# Patient Record
Sex: Male | Born: 1970 | Hispanic: No | Marital: Married | Smoking: Never smoker
Health system: Southern US, Community
[De-identification: ages and names within clinical notes are randomized; demographics above are authoritative.]

## PROBLEM LIST (undated history)

## (undated) DIAGNOSIS — E119 Type 2 diabetes mellitus without complications: Secondary | ICD-10-CM

## (undated) DIAGNOSIS — I10 Essential (primary) hypertension: Secondary | ICD-10-CM

## (undated) HISTORY — DX: Type 2 diabetes mellitus without complications: E11.9

## (undated) HISTORY — DX: Essential (primary) hypertension: I10

## (undated) HISTORY — PX: OTHER SURGICAL HISTORY: SHX169

---

## 2011-10-22 ENCOUNTER — Ambulatory Visit: Payer: Self-pay

## 2011-10-22 DIAGNOSIS — J111 Influenza due to unidentified influenza virus with other respiratory manifestations: Secondary | ICD-10-CM

## 2011-10-22 DIAGNOSIS — I10 Essential (primary) hypertension: Secondary | ICD-10-CM

## 2011-10-22 DIAGNOSIS — J209 Acute bronchitis, unspecified: Secondary | ICD-10-CM

## 2012-01-14 ENCOUNTER — Ambulatory Visit: Payer: Self-pay | Admitting: Internal Medicine

## 2012-01-14 VITALS — BP 133/82 | HR 61 | Temp 98.3°F | Resp 16 | Ht 68.0 in | Wt 156.4 lb

## 2012-01-14 DIAGNOSIS — I10 Essential (primary) hypertension: Secondary | ICD-10-CM | POA: Insufficient documentation

## 2012-01-14 DIAGNOSIS — Z0000 Encounter for general adult medical examination without abnormal findings: Secondary | ICD-10-CM

## 2012-01-14 DIAGNOSIS — Z79899 Other long term (current) drug therapy: Secondary | ICD-10-CM

## 2012-01-14 LAB — POCT CBC
Granulocyte percent: 58.1 %G (ref 37–80)
Lymph, poc: 1.9 (ref 0.6–3.4)
MCHC: 33.1 g/dL (ref 31.8–35.4)
MID (cbc): 0.3 (ref 0–0.9)
POC Granulocyte: 3.1 (ref 2–6.9)
POC LYMPH PERCENT: 35.9 %L (ref 10–50)
POC MID %: 6 %M (ref 0–12)
Platelet Count, POC: 344 10*3/uL (ref 142–424)
RDW, POC: 14 %

## 2012-01-14 LAB — COMPREHENSIVE METABOLIC PANEL
ALT: 16 U/L (ref 0–53)
AST: 16 U/L (ref 0–37)
CO2: 26 mEq/L (ref 19–32)
Chloride: 104 mEq/L (ref 96–112)
Creat: 0.93 mg/dL (ref 0.50–1.35)
Sodium: 139 mEq/L (ref 135–145)
Total Bilirubin: 0.7 mg/dL (ref 0.3–1.2)
Total Protein: 7.4 g/dL (ref 6.0–8.3)

## 2012-01-14 LAB — POCT UA - MICROSCOPIC ONLY
Casts, Ur, LPF, POC: NEGATIVE
WBC, Ur, HPF, POC: NEGATIVE
Yeast, UA: NEGATIVE

## 2012-01-14 LAB — LIPID PANEL
HDL: 47 mg/dL (ref >39)
LDL Cholesterol: 121 mg/dL — ABNORMAL HIGH (ref 0–99)
Total CHOL/HDL Ratio: 4 Ratio

## 2012-01-14 LAB — TSH: TSH: 2.406 u[IU]/mL (ref 0.350–4.500)

## 2012-01-14 LAB — GLUCOSE, POCT (MANUAL RESULT ENTRY): POC Glucose: 96

## 2012-01-14 MED ORDER — AMLODIPINE BESYLATE 10 MG PO TABS: 10.0000 mg | ORAL_TABLET | Freq: Every day | ORAL | Status: DC

## 2012-01-14 NOTE — Patient Instructions (Signed)

## 2012-01-14 NOTE — Progress Notes (Signed)
  Subjective:    Patient ID: Lonnie Richardson, male    DOB: [birth year], 41 y.o.   MRN: [id number]  HPI Mild HTN controlled. Feels great See scanned hx    Review of Systems See scanned ROS     Objective:   Physical Exam Complete exam normal        Assessment & Plan:  Continue exercising See eye MD Refill meds 1 year

## 2012-11-02 ENCOUNTER — Other Ambulatory Visit: Payer: Self-pay | Admitting: Internal Medicine

## 2012-11-09 ENCOUNTER — Ambulatory Visit: Payer: Self-pay | Admitting: Family Medicine

## 2012-11-09 VITALS — BP 110/72 | HR 73 | Temp 97.8°F | Resp 16 | Ht 67.5 in | Wt 165.8 lb

## 2012-11-09 DIAGNOSIS — H6500 Acute serous otitis media, unspecified ear: Secondary | ICD-10-CM

## 2012-11-09 DIAGNOSIS — J029 Acute pharyngitis, unspecified: Secondary | ICD-10-CM

## 2012-11-09 DIAGNOSIS — I10 Essential (primary) hypertension: Secondary | ICD-10-CM

## 2012-11-09 MED ORDER — CEFDINIR 300 MG PO CAPS: 300.0000 mg | ORAL_CAPSULE | Freq: Two times a day (BID) | ORAL | Status: DC

## 2012-11-09 MED ORDER — AMLODIPINE BESYLATE 10 MG PO TABS: 10.0000 mg | ORAL_TABLET | Freq: Every day | ORAL | Status: DC

## 2012-11-09 NOTE — Progress Notes (Signed)
Subjective:    Patient ID: Lonnie Richardson, male    DOB: 01-31-1971, 42 y.o.   MRN: [id number] Chief Complaint  Patient presents with  . Hypertension    medication refill  . Cough    2 days  . Sore Throat    HPI  Req refill on norvasc 10 - checks bp outside of office and usu 120s-130s.  Reports med was changed once and he has severe edema so he just want to cont current med at current dose.  Sore throat and cough x 2d, worsening.  Mild nasal congestion, no f/c.  No GI/GU sxs. No myalgias/arthralgias.  Past Medical History  Diagnosis Date  . Hypertension    Current Outpatient Prescriptions on File Prior to Visit  Medication Sig Dispense Refill  . amLODipine (NORVASC) 10 MG tablet Take 1 tablet (10 mg total) by mouth daily.  90 tablet  3   No Known Allergies   Review of Systems  Constitutional: Negative for fever, chills, diaphoresis, activity change, appetite change, fatigue and unexpected weight change.  HENT: Positive for congestion, sore throat, rhinorrhea and postnasal drip. Negative for hearing loss, ear pain, mouth sores, neck pain, neck stiffness, sinus pressure, tinnitus and ear discharge.   Respiratory: Positive for cough. Negative for shortness of breath.   Cardiovascular: Negative for chest pain.  Gastrointestinal: Negative for nausea, vomiting, abdominal pain, diarrhea and constipation.  Genitourinary: Negative for dysuria.  Musculoskeletal: Negative for myalgias and arthralgias.  Skin: Negative for rash.  Neurological: Positive for headaches. Negative for syncope.  Hematological: Positive for adenopathy.  Psychiatric/Behavioral: Negative for sleep disturbance.      BP 110/72  Pulse 73  Temp 97.8 F (36.6 C) (Oral)  Resp 16  Ht 5' 7.5" (1.715 m)  Wt 165 lb 12.8 oz (75.206 kg)  BMI 25.58 kg/m2  SpO2 100% Objective:   Physical Exam  Constitutional: He is oriented to person, place, and time. He appears well-developed and well-nourished. No distress.  HENT:    Head: Normocephalic and atraumatic.  Right Ear: External ear and ear canal normal. Tympanic membrane is retracted. A middle ear effusion is present.  Left Ear: External ear and ear canal normal. Tympanic membrane is injected, erythematous and retracted. A middle ear effusion is present.  Nose: Mucosal edema and rhinorrhea present.  Mouth/Throat: Uvula is midline and mucous membranes are normal. Posterior oropharyngeal edema and posterior oropharyngeal erythema present. No oropharyngeal exudate.  Eyes: Conjunctivae normal are normal. Right eye exhibits no discharge. Left eye exhibits no discharge. No scleral icterus.  Neck: Normal range of motion. Neck supple. No thyromegaly present.  Cardiovascular: Normal rate, regular rhythm, normal heart sounds and intact distal pulses.   Pulmonary/Chest: Effort normal and breath sounds normal. No respiratory distress.  Lymphadenopathy:       Head (right side): Submandibular adenopathy present. No preauricular and no posterior auricular adenopathy present.       Head (left side): Submandibular adenopathy present. No preauricular and no posterior auricular adenopathy present.    He has cervical adenopathy.       Left cervical: Superficial cervical adenopathy present.       Right: No supraclavicular adenopathy present.       Left: No supraclavicular adenopathy present.  Neurological: He is alert and oriented to person, place, and time.  Skin: Skin is warm and dry. He is not diaphoretic. No erythema.  Psychiatric: He has a normal mood and affect. His behavior is normal.  Assessment & Plan:   1. HTN - refill norvasc 10 x 1 yr. BP stable and pt does not want to change med despite expense 2. Otitis media - cefdinir 300 bid x 1 wk 3. Pharyngitis - salt water gargles, hot tea w/ lemon and honey, steam. RTC if worsening

## 2013-07-01 ENCOUNTER — Other Ambulatory Visit: Payer: Self-pay | Admitting: Family Medicine

## 2014-01-13 ENCOUNTER — Other Ambulatory Visit: Payer: Self-pay | Admitting: Family Medicine

## 2014-09-05 ENCOUNTER — Ambulatory Visit: Payer: Self-pay | Admitting: Internal Medicine

## 2014-10-09 ENCOUNTER — Ambulatory Visit (INDEPENDENT_AMBULATORY_CARE_PROVIDER_SITE_OTHER): Payer: 59 | Admitting: Family Medicine

## 2014-10-09 VITALS — BP 120/74 | HR 78 | Temp 98.8°F | Resp 18 | Ht 68.0 in | Wt 178.4 lb

## 2014-10-09 DIAGNOSIS — J029 Acute pharyngitis, unspecified: Secondary | ICD-10-CM

## 2014-10-09 DIAGNOSIS — L818 Other specified disorders of pigmentation: Secondary | ICD-10-CM

## 2014-10-09 DIAGNOSIS — L819 Disorder of pigmentation, unspecified: Secondary | ICD-10-CM

## 2014-10-09 DIAGNOSIS — R9431 Abnormal electrocardiogram [ECG] [EKG]: Secondary | ICD-10-CM

## 2014-10-09 DIAGNOSIS — Z0000 Encounter for general adult medical examination without abnormal findings: Secondary | ICD-10-CM

## 2014-10-09 DIAGNOSIS — Z8249 Family history of ischemic heart disease and other diseases of the circulatory system: Secondary | ICD-10-CM

## 2014-10-09 DIAGNOSIS — R739 Hyperglycemia, unspecified: Secondary | ICD-10-CM

## 2014-10-09 DIAGNOSIS — E119 Type 2 diabetes mellitus without complications: Secondary | ICD-10-CM

## 2014-10-09 DIAGNOSIS — Z23 Encounter for immunization: Secondary | ICD-10-CM

## 2014-10-09 DIAGNOSIS — I10 Essential (primary) hypertension: Secondary | ICD-10-CM

## 2014-10-09 LAB — COMPREHENSIVE METABOLIC PANEL
ALK PHOS: 60 U/L (ref 39–117)
ALT: 23 U/L (ref 0–53)
AST: 14 U/L (ref 0–37)
Albumin: 4.2 g/dL (ref 3.5–5.2)
BILIRUBIN TOTAL: 0.5 mg/dL (ref 0.2–1.2)
BUN: 9 mg/dL (ref 6–23)
CO2: 26 mEq/L (ref 19–32)
Calcium: 9.5 mg/dL (ref 8.4–10.5)
Chloride: 104 mEq/L (ref 96–112)
Creat: 0.97 mg/dL (ref 0.50–1.35)
GLUCOSE: 134 mg/dL — AB (ref 70–99)
Potassium: 4.3 mEq/L (ref 3.5–5.3)
SODIUM: 138 meq/L (ref 135–145)
TOTAL PROTEIN: 7.2 g/dL (ref 6.0–8.3)

## 2014-10-09 LAB — GLUCOSE, POCT (MANUAL RESULT ENTRY): POC Glucose: 138 mg/dl — AB (ref 70–99)

## 2014-10-09 LAB — POCT RAPID STREP A (OFFICE): RAPID STREP A SCREEN: NEGATIVE

## 2014-10-09 LAB — TSH: TSH: 2.797 u[IU]/mL (ref 0.350–4.500)

## 2014-10-09 LAB — LIPID PANEL
CHOL/HDL RATIO: 4.1 ratio
Cholesterol: 176 mg/dL (ref 0–200)
HDL: 43 mg/dL (ref >39)
LDL Cholesterol: 114 mg/dL — ABNORMAL HIGH (ref 0–99)
Triglycerides: 93 mg/dL (ref <150)
VLDL: 19 mg/dL (ref 0–40)

## 2014-10-09 LAB — POCT GLYCOSYLATED HEMOGLOBIN (HGB A1C): Hemoglobin A1C: 7.3

## 2014-10-09 MED ORDER — AMLODIPINE BESYLATE 10 MG PO TABS: 10.0000 mg | ORAL_TABLET | Freq: Every day | ORAL | Status: DC

## 2014-10-09 MED ORDER — METFORMIN HCL 500 MG PO TABS: 500.0000 mg | ORAL_TABLET | Freq: Every day | ORAL | Status: DC

## 2014-10-09 NOTE — Progress Notes (Addendum)
Subjective:  This chart was scribed for [name], MD by Dellis Filbert, ED Scribe at [facility].The patient was seen in exam room 14 and the patient's care was started at 8:35 AM.   Patient ID: Lonnie Richardson, male    DOB: 1971/01/30, 43 y.o.   MRN: [id number] Chief Complaint  Patient presents with  . Annual Exam  . Sore Throat    Since Friday. Has improved a little   HPI HPI Comments: Lonnie Richardson is a 43 y.o. male who presents to [facility] complaining of a sore throat. Pt is also here for a PE and medication refill.  Pt has a history of HTN, takes norvasc 10 mg qd. Last  lab work was in 2013, border line elevated glucose of 103. Kidney function normal, border line lipids with LDL of 121. He does regularly check his BP at home and note no problems with his BP. He has no side effects with his current medication. Pt has had something to drink this morning. He denies thyroid problems.  Pt complains of a sore throat, onset 3 days ago, it was worse over the weekend but it has improved since. He has cough, generalized weakness, congestion, minor rhinorrhea, chills as associated symptoms. Pt states his symptoms have improved over the weekend. He has tried airbrone with some relief. He denies fever and sick contacts.  Pt also complains of a rash on both his legs, onset 2-3 years ago. Pt did have swelling in his legs but once he changed his HTN to norvasc his swelling subsided. The swelling lasted for about 1-2 weeks. He states his legs also become dry in the night.  Annual Exam: No FHx of cancer or any other medical problems, His father did have heart disease and PTCA at 12. Pt denies CP, SOB. Pt does not currently exercise.   He denies any liver problems.   Pt does not see a dentist and has never seen an optometrist. Pt is married and has no partners outside of his marriage, he denies a chance of STDs. Pt has never gotten a flu shot and he refuses one.  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  Patient Active Problem List   Diagnosis Date Noted  . HTN (hypertension) 01/14/2012   Past Medical History  Diagnosis Date  . Hypertension    History reviewed. No pertinent past surgical history. No Known Allergies Prior to Admission medications   Medication Sig Start Date End Date Taking? Authorizing Provider  amLODipine (NORVASC) 10 MG tablet Take 1 tablet (10 mg total) by mouth daily. 11/09/12 11/09/13  Shawnee Knapp, MD  cefdinir (OMNICEF) 300 MG capsule Take 1 capsule (300 mg total) by mouth 2 (two) times daily. Patient not taking: Reported on 10/09/2014 11/09/12   Shawnee Knapp, MD   History   Social History  . Marital Status: Married    Spouse Name: N/A    Number of Children: N/A  . Years of Education: N/A   Occupational History  . Not on file.   Social History Main Topics  . Smoking status: Never Smoker   . Smokeless tobacco: Not on file  . Alcohol Use: Yes  . Drug Use: No  . Sexual Activity: Yes   Other Topics Concern  . Not on file   Social History Narrative   Review of Systems  Constitutional: Positive for chills. Negative for fever.  HENT: Positive for congestion, rhinorrhea and sore throat.   Respiratory: Positive for  cough. Negative for shortness of breath.   Cardiovascular: Negative for chest pain and leg swelling.  Musculoskeletal: Positive for myalgias.  All other systems reviewed and are negative. 13 point ROS per pt health survey reviewed. Negative other than above     Objective:  BP 120/74 mmHg  Pulse 78  Temp(Src) 98.8 F (37.1 C) (Oral)  Resp 18  Ht _0  (1.727 m)  Wt 178 lb 6.4 oz (80.922 kg)  BMI 27.13 kg/m2  SpO2 98%  Physical Exam  Constitutional: He is oriented to person, place, and time. He appears well-developed and well-nourished.  HENT:  Head: Normocephalic and atraumatic.  Right Ear: Tympanic membrane, external ear and ear canal normal.  Left Ear: Tympanic membrane, external ear and ear canal normal.  Nose:  No rhinorrhea.  Mouth/Throat: Oropharynx is clear and moist and mucous membranes are normal. No oropharyngeal exudate or posterior oropharyngeal erythema.  No sinus tenderness  Eyes: Conjunctivae and EOM are normal. Pupils are equal, round, and reactive to light.  Neck: Normal range of motion. Neck supple.  Cardiovascular: Normal rate, regular rhythm, normal heart sounds and intact distal pulses.   No murmur heard. Pulmonary/Chest: Effort normal and breath sounds normal. He has no wheezes. He has no rhonchi. He has no rales.  Abdominal: Soft. There is no tenderness.  Musculoskeletal: Normal range of motion.  Lymphadenopathy:    He has no cervical adenopathy.  Neurological: He is alert and oriented to person, place, and time.  Skin: Skin is warm and dry. No rash noted.  Hyperpigmentation with some decreased hair lower legs bilaterally. No erythema or other rash.  Psychiatric: He has a normal mood and affect. His behavior is normal.  Nursing note and vitals reviewed. EKG; Sinus rhythm rate 67, flat, no specific T-waves and inferior leads. As well as V5, V6. No acute changes. Results for orders placed or performed in visit on 10/09/14  POCT glucose (manual entry)  Result Value Ref Range   POC Glucose 138 (A) 70 - 99 mg/dl  POCT rapid strep A  Result Value Ref Range   Rapid Strep A Screen Negative Negative  POCT glycosylated hemoglobin (Hb A1C)  Result Value Ref Range   Hemoglobin A1C 7.3       Assessment & Plan:   Lonnie Richardson is a 43 y.o. male Annual physical exam - Plan: TSH, Lipid panel, Comprehensive metabolic panel, EKG 10-CHEN, POCT glucose (manual entry)  --anticipatory guidance as below in AVS, screening labs above. Health maintenance items as above in HPI discussed/recommended as applicable.   Hyperglycemia Type 2 diabetes mellitus without complication - Plan: metFORMIN (GLUCOPHAGE) 500 MG tablet, Ambulatory referral to diabetic education- Plan: Comprehensive metabolic  panel, POCT glucose (manual entry), POCT glycosylated hemoglobin (Hb A1C)  -new diagnosis of diabetes. Start metformin 578m QD, [facility].  Can purchase meter prior or may be able to receive/recieve teaching there. Info given below, but can discuss further in few weeks.    Essential hypertension - Plan: Comprehensive metabolic panel, amLODipine (NORVASC) 10 MG tablet  -stable. Refilled norvasc at current dose. Anticipate change or adjust dose to add ace inhibitor with diabetes - can discuss at follow up .  Sore throat - Plan: POCT rapid strep A, Throat culture ([facility])  -viral illness likely. Sx care, info below, rtc precautions.   Family history of coronary artery disease - Plan: Lipid panel, Comprehensive metabolic panel, EKG 127-POEU Ambulatory referral to Cardiology.Nonspecific abnormal electrocardiogram (ECG) (EKG) - Plan: Ambulatory referral to  Cardiology  -FH of CAD and a few questionable findings on EKG. with RF"s of HTN. FH CAD, and new diagnosis of diabetes - will refer to cardiology for eval +/- stress testing.   Stasis pigmentation of lower extremity  -may be related to concurrent DM2, and prior hx of LE edema. Will follow up in few weeks. Labs pending above.   Need for Tdap vaccination - Plan: Tdap vaccine greater than or equal to 7yo IM given. Refused flu vaccine.      Meds ordered this encounter  Medications  . metFORMIN (GLUCOPHAGE) 500 MG tablet    Sig: Take 1 tablet (500 mg total) by mouth daily with breakfast.    Dispense:  30 tablet    Refill:  1  . amLODipine (NORVASC) 10 MG tablet    Sig: Take 1 tablet (10 mg total) by mouth daily.    Dispense:  90 tablet    Refill:  1   Patient Instructions  You should receive a call or letter about your lab results within the next week to 10 days.  Lozenges for sore throat. Fluids, rest, other information below.  Your labwork indicates diabetes today.  It is close to "goal level", but can start metformin  for now to help with blood sugar control.  I will also refer you to nutrition/diabetes education. They can arrange a blood sugar meter there and other teaching.  Follow up with me in the next 3-4 weeks to recheck above.  This can be done as an appointment if you wish.  Return to discuss darkening of skin further, but want to start with other labs first. This may be due to swelling in legs in past, but more likely related to your blood sugar - diabetes.    Your EKG was not completely normal, but not showing definite signs of heart issues. With your family history of heart problems in father, recommend referral to cardiologist to review this EKG and decide if any further workup needed. Recommend you avoid exertional exercise until evaluated by cardiology first. Return to the clinic or go to the nearest emergency room if any of your symptoms worsen or new symptoms occur.   Diabetes and Standards of Medical Care Diabetes is complicated. You may find that your diabetes team includes a dietitian, nurse, diabetes educator, eye doctor, and more. To help everyone know what is going on and to help you get the care you deserve, the following schedule of care was developed to help keep you on track. Below are the tests, exams, vaccines, medicines, education, and plans you will need. HbA1c test This test shows how well you have controlled your glucose over the past 2-3 months. It is used to see if your diabetes management plan needs to be adjusted.   It is performed at least 2 times a year if you are meeting treatment goals.  It is performed 4 times a year if therapy has changed or if you are not meeting treatment goals. Blood pressure test  This test is performed at every routine medical visit. The goal is less than 140/90 mm Hg for most people, but 130/80 mm Hg in some cases. Ask your health care provider about your goal. Dental exam  Follow up with the dentist regularly. Eye exam  If you are diagnosed  with type 1 diabetes as a child, get an exam upon reaching the age of 81 years or older and have had diabetes for 3-5 years. Yearly eye exams are recommended after  that initial eye exam.  If you are diagnosed with type 1 diabetes as an adult, get an exam within 5 years of diagnosis and then yearly.  If you are diagnosed with type 2 diabetes, get an exam as soon as possible after the diagnosis and then yearly. Foot care exam  Visual foot exams are performed at every routine medical visit. The exams check for cuts, injuries, or other problems with the feet.  A comprehensive foot exam should be done yearly. This includes visual inspection as well as assessing foot pulses and testing for loss of sensation.  Check your feet nightly for cuts, injuries, or other problems with your feet. Tell your health care provider if anything is not healing. Kidney function test (urine microalbumin)  This test is performed once a year.  Type 1 diabetes: The first test is performed 5 years after diagnosis.  Type 2 diabetes: The first test is performed at the time of diagnosis.  A serum creatinine and estimated glomerular filtration rate (eGFR) test is done once a year to assess the level of chronic kidney disease (CKD), if present. Lipid profile (cholesterol, HDL, LDL, triglycerides)  Performed every 5 years for most people.  The goal for LDL is less than 100 mg/dL. If you are at high risk, the goal is less than 70 mg/dL.  The goal for HDL is 40 mg/dL-50 mg/dL for men and 50 mg/dL-60 mg/dL for women. An HDL cholesterol of 60 mg/dL or higher gives some protection against heart disease.  The goal for triglycerides is less than 150 mg/dL. Influenza vaccine, pneumococcal vaccine, and hepatitis B vaccine  The influenza vaccine is recommended yearly.  It is recommended that people with diabetes who are over 43 years old get the pneumonia vaccine. In some cases, two separate shots may be given. Ask your health  care provider if your pneumonia vaccination is up to date.  The hepatitis B vaccine is also recommended for adults with diabetes. Diabetes self-management education  Education is recommended at diagnosis and ongoing as needed. Treatment plan  Your treatment plan is reviewed at every medical visit. Document Released: 08/10/2009 Document Revised: 02/27/2014 Document Reviewed: 03/15/2013 [facility] Patient Information 2015 [place], [place]. This information is not intended to replace advice given to you by your health care provider. Make sure you discuss any questions you have with your health care provider.   Upper Respiratory Infection, Adult An upper respiratory infection (URI) is also sometimes known as the common cold. The upper respiratory tract includes the nose, sinuses, throat, trachea, and bronchi. Bronchi are the airways leading to the lungs. Most people improve within 1 week, but symptoms can last up to 2 weeks. A residual cough may last even longer.  CAUSES Many different viruses can infect the tissues lining the upper respiratory tract. The tissues become irritated and inflamed and often become very moist. Mucus production is also common. A cold is contagious. You can easily spread the virus to others by oral contact. This includes kissing, sharing a glass, coughing, or sneezing. Touching your mouth or nose and then touching a surface, which is then touched by another person, can also spread the virus. SYMPTOMS  Symptoms typically develop 1 to 3 days after you come in contact with a cold virus. Symptoms vary from person to person. They may include:  Runny nose.  Sneezing.  Nasal congestion.  Sinus irritation.  Sore throat.  Loss of voice (laryngitis).  Cough.  Fatigue.  Muscle aches.  Loss of appetite.  Headache.  Low-grade fever. DIAGNOSIS  You might diagnose your own cold based on familiar symptoms, since most people get a cold 2 to 3 times a year. Your caregiver  can confirm this based on your exam. Most importantly, your caregiver can check that your symptoms are not due to another disease such as strep throat, sinusitis, pneumonia, asthma, or epiglottitis. Blood tests, throat tests, and X-rays are not necessary to diagnose a common cold, but they may sometimes be helpful in excluding other more serious diseases. Your caregiver will decide if any further tests are required. RISKS AND COMPLICATIONS  You may be at risk for a more severe case of the common cold if you smoke cigarettes, have chronic heart disease (such as heart failure) or lung disease (such as asthma), or if you have a weakened immune system. The very young and very old are also at risk for more serious infections. Bacterial sinusitis, middle ear infections, and bacterial pneumonia can complicate the common cold. The common cold can worsen asthma and chronic obstructive pulmonary disease (COPD). Sometimes, these complications can require emergency medical care and may be life-threatening. PREVENTION  The best way to protect against getting a cold is to practice good hygiene. Avoid oral or hand contact with people with cold symptoms. Wash your hands often if contact occurs. There is no clear evidence that vitamin C, vitamin E, echinacea, or exercise reduces the chance of developing a cold. However, it is always recommended to get plenty of rest and practice good nutrition. TREATMENT  Treatment is directed at relieving symptoms. There is no cure. Antibiotics are not effective, because the infection is caused by a virus, not by bacteria. Treatment may include:  Increased fluid intake. Sports drinks offer valuable electrolytes, sugars, and fluids.  Breathing heated mist or steam (vaporizer or shower).  Eating chicken soup or other clear broths, and maintaining good nutrition.  Getting plenty of rest.  Using gargles or lozenges for comfort.  Controlling fevers with ibuprofen or acetaminophen as  directed by your caregiver.  Increasing usage of your inhaler if you have asthma. Zinc gel and zinc lozenges, taken in the first 24 hours of the common cold, can shorten the duration and lessen the severity of symptoms. Pain medicines may help with fever, muscle aches, and throat pain. A variety of non-prescription medicines are available to treat congestion and runny nose. Your caregiver can make recommendations and may suggest nasal or lung inhalers for other symptoms.  HOME CARE INSTRUCTIONS   Only take over-the-counter or prescription medicines for pain, discomfort, or fever as directed by your caregiver.  Use a warm mist humidifier or inhale steam from a shower to increase air moisture. This may keep secretions moist and make it easier to breathe.  Drink enough water and fluids to keep your urine clear or pale yellow.  Rest as needed.  Return to work when your temperature has returned to normal or as your caregiver advises. You may need to stay home longer to avoid infecting others. You can also use a face mask and careful hand washing to prevent spread of the virus. SEEK MEDICAL CARE IF:   After the first few days, you feel you are getting worse rather than better.  You need your caregiver's advice about medicines to control symptoms.  You develop chills, worsening shortness of breath, or brown or red sputum. These may be signs of pneumonia.  You develop yellow or brown nasal discharge or pain in the face, especially when you bend  forward. These may be signs of sinusitis.  You develop a fever, swollen neck glands, pain with swallowing, or white areas in the back of your throat. These may be signs of strep throat. SEEK IMMEDIATE MEDICAL CARE IF:   You have a fever.  You develop severe or persistent headache, ear pain, sinus pain, or chest pain.  You develop wheezing, a prolonged cough, cough up blood, or have a change in your usual mucus (if you have chronic lung disease).  You  develop sore muscles or a stiff neck. Document Released: 04/08/2001 Document Revised: 01/05/2012 Document Reviewed: 01/18/2014 [facility] Patient Information 2015 [place], [place]. This information is not intended to replace advice given to you by your health care provider. Make sure you discuss any questions you have with your health care provider.   Sore Throat A sore throat is pain, burning, irritation, or scratchiness of the throat. There is often pain or tenderness when swallowing or talking. A sore throat may be accompanied by other symptoms, such as coughing, sneezing, fever, and swollen neck glands. A sore throat is often the first sign of another sickness, such as a cold, flu, strep throat, or mononucleosis (commonly known as mono). Most sore throats go away without medical treatment. CAUSES  The most common causes of a sore throat include:  A viral infection, such as a cold, flu, or mono.  A bacterial infection, such as strep throat, tonsillitis, or whooping cough.  Seasonal allergies.  Dryness in the air.  Irritants, such as smoke or pollution.  Gastroesophageal reflux disease (GERD). HOME CARE INSTRUCTIONS   Only take over-the-counter medicines as directed by your caregiver.  Drink enough fluids to keep your urine clear or pale yellow.  Rest as needed.  Try using throat sprays, lozenges, or sucking on hard candy to ease any pain (if older than 4 years or as directed).  Sip warm liquids, such as broth, herbal tea, or warm water with honey to relieve pain temporarily. You may also eat or drink cold or frozen liquids such as frozen ice pops.  Gargle with salt water (mix 1 tsp salt with 8 oz of water).  Do not smoke and avoid secondhand smoke.  Put a cool-mist humidifier in your bedroom at night to moisten the air. You can also turn on a hot shower and sit in the bathroom with the door closed for 5-10 minutes. SEEK IMMEDIATE MEDICAL CARE IF:  You have difficulty  breathing.  You are unable to swallow fluids, soft foods, or your saliva.  You have increased swelling in the throat.  Your sore throat does not get better in 7 days.  You have nausea and vomiting.  You have a fever or persistent symptoms for more than 2-3 days.  You have a fever and your symptoms suddenly get worse. MAKE SURE YOU:   Understand these instructions.  Will watch your condition.  Will get help right away if you are not doing well or get worse. Document Released: 11/20/2004 Document Revised: 09/29/2012 Document Reviewed: 06/20/2012 [facility] Patient Information 2015 [place], [place]. This information is not intended to replace advice given to you by your health care provider. Make sure you discuss any questions you have with your health care provider.  Keeping you healthy  Get these tests  Blood pressure- Have your blood pressure checked once a year by your healthcare provider.  Normal blood pressure is 120/80.  Weight- Have your body mass index (BMI) calculated to screen for obesity.  BMI is a measure  of body fat based on height and weight. You can also calculate your own BMI at [employer].  Cholesterol- Have your cholesterol checked regularly starting at age 68, sooner may be necessary if you have diabetes, high blood pressure, if a family member developed heart diseases at an early age or if you smoke.   Chlamydia, HIV, and other sexual transmitted disease- Get screened each year until the age of 80 then within three months of each new sexual partner.  Diabetes- Have your blood sugar checked regularly if you have high blood pressure, high cholesterol, a family history of diabetes or if you are overweight.  Get these vaccines  Flu shot- Every fall.  Tetanus shot- Every 10 years.  Menactra- Single dose; prevents meningitis.  Take these steps  Don't smoke- If you do smoke, ask your healthcare provider about quitting. For tips on how to quit, go  to www.smokefree.gov or call 1-800-QUIT-NOW.  Be physically active- Exercise 5 days a week for at least 30 minutes.  If you are not already physically active start slow and gradually work up to 30 minutes of moderate physical activity.  Examples of moderate activity include walking briskly, mowing the yard, dancing, swimming bicycling, etc.  Eat a healthy diet- Eat a variety of healthy foods such as fruits, vegetables, low fat milk, low fat cheese, yogurt, lean meats, poultry, fish, beans, tofu, etc.  For more information on healthy eating, go to www.thenutritionsource.org  Drink alcohol in moderation- Limit alcohol intake two drinks or less a day.  Never drink and drive.  Dentist- Brush and floss teeth twice daily; visit your dentis twice a year.  Depression-Your emotional health is as important as your physical health.  If you're feeling down, losing interest in things you normally enjoy please talk with your healthcare provider.  Gun Safety- If you keep a gun in your home, keep it unloaded and with the safety lock on.  Bullets should be stored separately.  Helmet use- Always wear a helmet when riding a motorcycle, bicycle, rollerblading or skateboarding.  Safe sex- If you may be exposed to a sexually transmitted infection, use a condom  Seat belts- Seat bels can save your life; always wear one.  Smoke/Carbon Monoxide detectors- These detectors need to be installed on the appropriate level of your home.  Replace batteries at least once a year.  Skin Cancer- When out in the sun, cover up and use sunscreen SPF 15 or higher.  Violence- If anyone is threatening or hurting you, please tell your healthcare provider.    I personally performed the services described in this documentation, which was scribed in my presence. The recorded information has been reviewed and considered, and addended by me as needed.

## 2014-10-09 NOTE — Patient Instructions (Addendum)
You should receive a call or letter about your lab results within the next week to 10 days.  Lozenges for sore throat. Fluids, rest, other information below.  Your labwork indicates diabetes today.  It is close to "goal level", but can start metformin for now to help with blood sugar control.  I will also refer you to nutrition/diabetes education. They can arrange a blood sugar meter there and other teaching.  Follow up with me in the next 3-4 weeks to recheck above.  This can be done as an appointment if you wish.  Return to discuss darkening of skin further, but want to start with other labs first. This may be due to swelling in legs in past, but more likely related to your blood sugar - diabetes.    Your EKG was not completely normal, but not showing definite signs of heart issues. With your family history of heart problems in father, recommend referral to cardiologist to review this EKG and decide if any further workup needed. Recommend you avoid exertional exercise until evaluated by cardiology first. Return to the clinic or go to the nearest emergency room if any of your symptoms worsen or new symptoms occur.   Diabetes and Standards of Medical Care Diabetes is complicated. You may find that your diabetes team includes a dietitian, nurse, diabetes educator, eye doctor, and more. To help everyone know what is going on and to help you get the care you deserve, the following schedule of care was developed to help keep you on track. Below are the tests, exams, vaccines, medicines, education, and plans you will need. HbA1c test This test shows how well you have controlled your glucose over the past 2-3 months. It is used to see if your diabetes management plan needs to be adjusted.   It is performed at least 2 times a year if you are meeting treatment goals.  It is performed 4 times a year if therapy has changed or if you are not meeting treatment goals. Blood pressure test  This test is  performed at every routine medical visit. The goal is less than 140/90 mm Hg for most people, but 130/80 mm Hg in some cases. Ask your health care provider about your goal. Dental exam  Follow up with the dentist regularly. Eye exam  If you are diagnosed with type 1 diabetes as a child, get an exam upon reaching the age of 15 years or older and have had diabetes for 3-5 years. Yearly eye exams are recommended after that initial eye exam.  If you are diagnosed with type 1 diabetes as an adult, get an exam within 5 years of diagnosis and then yearly.  If you are diagnosed with type 2 diabetes, get an exam as soon as possible after the diagnosis and then yearly. Foot care exam  Visual foot exams are performed at every routine medical visit. The exams check for cuts, injuries, or other problems with the feet.  A comprehensive foot exam should be done yearly. This includes visual inspection as well as assessing foot pulses and testing for loss of sensation.  Check your feet nightly for cuts, injuries, or other problems with your feet. Tell your health care provider if anything is not healing. Kidney function test (urine microalbumin)  This test is performed once a year.  Type 1 diabetes: The first test is performed 5 years after diagnosis.  Type 2 diabetes: The first test is performed at the time of diagnosis.  A serum creatinine  and estimated glomerular filtration rate (eGFR) test is done once a year to assess the level of chronic kidney disease (CKD), if present. Lipid profile (cholesterol, HDL, LDL, triglycerides)  Performed every 5 years for most people.  The goal for LDL is less than 100 mg/dL. If you are at high risk, the goal is less than 70 mg/dL.  The goal for HDL is 40 mg/dL-50 mg/dL for men and 50 mg/dL-60 mg/dL for women. An HDL cholesterol of 60 mg/dL or higher gives some protection against heart disease.  The goal for triglycerides is less than 150 mg/dL. Influenza  vaccine, pneumococcal vaccine, and hepatitis B vaccine  The influenza vaccine is recommended yearly.  It is recommended that people with diabetes who are over 43 years old get the pneumonia vaccine. In some cases, two separate shots may be given. Ask your health care provider if your pneumonia vaccination is up to date.  The hepatitis B vaccine is also recommended for adults with diabetes. Diabetes self-management education  Education is recommended at diagnosis and ongoing as needed. Treatment plan  Your treatment plan is reviewed at every medical visit. Document Released: 08/10/2009 Document Revised: 02/27/2014 Document Reviewed: 03/15/2013 [facility] Patient Information 2015 [place], [place]. This information is not intended to replace advice given to you by your health care provider. Make sure you discuss any questions you have with your health care provider.   Upper Respiratory Infection, Adult An upper respiratory infection (URI) is also sometimes known as the common cold. The upper respiratory tract includes the nose, sinuses, throat, trachea, and bronchi. Bronchi are the airways leading to the lungs. Most people improve within 1 week, but symptoms can last up to 2 weeks. A residual cough may last even longer.  CAUSES Many different viruses can infect the tissues lining the upper respiratory tract. The tissues become irritated and inflamed and often become very moist. Mucus production is also common. A cold is contagious. You can easily spread the virus to others by oral contact. This includes kissing, sharing a glass, coughing, or sneezing. Touching your mouth or nose and then touching a surface, which is then touched by another person, can also spread the virus. SYMPTOMS  Symptoms typically develop 1 to 3 days after you come in contact with a cold virus. Symptoms vary from person to person. They may include:  Runny nose.  Sneezing.  Nasal congestion.  Sinus irritation.  Sore  throat.  Loss of voice (laryngitis).  Cough.  Fatigue.  Muscle aches.  Loss of appetite.  Headache.  Low-grade fever. DIAGNOSIS  You might diagnose your own cold based on familiar symptoms, since most people get a cold 2 to 3 times a year. Your caregiver can confirm this based on your exam. Most importantly, your caregiver can check that your symptoms are not due to another disease such as strep throat, sinusitis, pneumonia, asthma, or epiglottitis. Blood tests, throat tests, and X-rays are not necessary to diagnose a common cold, but they may sometimes be helpful in excluding other more serious diseases. Your caregiver will decide if any further tests are required. RISKS AND COMPLICATIONS  You may be at risk for a more severe case of the common cold if you smoke cigarettes, have chronic heart disease (such as heart failure) or lung disease (such as asthma), or if you have a weakened immune system. The very young and very old are also at risk for more serious infections. Bacterial sinusitis, middle ear infections, and bacterial pneumonia can complicate the common  cold. The common cold can worsen asthma and chronic obstructive pulmonary disease (COPD). Sometimes, these complications can require emergency medical care and may be life-threatening. PREVENTION  The best way to protect against getting a cold is to practice good hygiene. Avoid oral or hand contact with people with cold symptoms. Wash your hands often if contact occurs. There is no clear evidence that vitamin C, vitamin E, echinacea, or exercise reduces the chance of developing a cold. However, it is always recommended to get plenty of rest and practice good nutrition. TREATMENT  Treatment is directed at relieving symptoms. There is no cure. Antibiotics are not effective, because the infection is caused by a virus, not by bacteria. Treatment may include:  Increased fluid intake. Sports drinks offer valuable electrolytes, sugars, and  fluids.  Breathing heated mist or steam (vaporizer or shower).  Eating chicken soup or other clear broths, and maintaining good nutrition.  Getting plenty of rest.  Using gargles or lozenges for comfort.  Controlling fevers with ibuprofen or acetaminophen as directed by your caregiver.  Increasing usage of your inhaler if you have asthma. Zinc gel and zinc lozenges, taken in the first 24 hours of the common cold, can shorten the duration and lessen the severity of symptoms. Pain medicines may help with fever, muscle aches, and throat pain. A variety of non-prescription medicines are available to treat congestion and runny nose. Your caregiver can make recommendations and may suggest nasal or lung inhalers for other symptoms.  HOME CARE INSTRUCTIONS   Only take over-the-counter or prescription medicines for pain, discomfort, or fever as directed by your caregiver.  Use a warm mist humidifier or inhale steam from a shower to increase air moisture. This may keep secretions moist and make it easier to breathe.  Drink enough water and fluids to keep your urine clear or pale yellow.  Rest as needed.  Return to work when your temperature has returned to normal or as your caregiver advises. You may need to stay home longer to avoid infecting others. You can also use a face mask and careful hand washing to prevent spread of the virus. SEEK MEDICAL CARE IF:   After the first few days, you feel you are getting worse rather than better.  You need your caregiver's advice about medicines to control symptoms.  You develop chills, worsening shortness of breath, or brown or red sputum. These may be signs of pneumonia.  You develop yellow or brown nasal discharge or pain in the face, especially when you bend forward. These may be signs of sinusitis.  You develop a fever, swollen neck glands, pain with swallowing, or white areas in the back of your throat. These may be signs of strep throat. SEEK  IMMEDIATE MEDICAL CARE IF:   You have a fever.  You develop severe or persistent headache, ear pain, sinus pain, or chest pain.  You develop wheezing, a prolonged cough, cough up blood, or have a change in your usual mucus (if you have chronic lung disease).  You develop sore muscles or a stiff neck. Document Released: 04/08/2001 Document Revised: 01/05/2012 Document Reviewed: 01/18/2014 [facility] Patient Information 2015 [place], [place]. This information is not intended to replace advice given to you by your health care provider. Make sure you discuss any questions you have with your health care provider.   Sore Throat A sore throat is pain, burning, irritation, or scratchiness of the throat. There is often pain or tenderness when swallowing or talking. A sore throat may be  accompanied by other symptoms, such as coughing, sneezing, fever, and swollen neck glands. A sore throat is often the first sign of another sickness, such as a cold, flu, strep throat, or mononucleosis (commonly known as mono). Most sore throats go away without medical treatment. CAUSES  The most common causes of a sore throat include:  A viral infection, such as a cold, flu, or mono.  A bacterial infection, such as strep throat, tonsillitis, or whooping cough.  Seasonal allergies.  Dryness in the air.  Irritants, such as smoke or pollution.  Gastroesophageal reflux disease (GERD). HOME CARE INSTRUCTIONS   Only take over-the-counter medicines as directed by your caregiver.  Drink enough fluids to keep your urine clear or pale yellow.  Rest as needed.  Try using throat sprays, lozenges, or sucking on hard candy to ease any pain (if older than 4 years or as directed).  Sip warm liquids, such as broth, herbal tea, or warm water with honey to relieve pain temporarily. You may also eat or drink cold or frozen liquids such as frozen ice pops.  Gargle with salt water (mix 1 tsp salt with 8 oz of water).  Do not  smoke and avoid secondhand smoke.  Put a cool-mist humidifier in your bedroom at night to moisten the air. You can also turn on a hot shower and sit in the bathroom with the door closed for 5-10 minutes. SEEK IMMEDIATE MEDICAL CARE IF:  You have difficulty breathing.  You are unable to swallow fluids, soft foods, or your saliva.  You have increased swelling in the throat.  Your sore throat does not get better in 7 days.  You have nausea and vomiting.  You have a fever or persistent symptoms for more than 2-3 days.  You have a fever and your symptoms suddenly get worse. MAKE SURE YOU:   Understand these instructions.  Will watch your condition.  Will get help right away if you are not doing well or get worse. Document Released: 11/20/2004 Document Revised: 09/29/2012 Document Reviewed: 06/20/2012 [facility] Patient Information 2015 [place], [place]. This information is not intended to replace advice given to you by your health care provider. Make sure you discuss any questions you have with your health care provider.  Keeping you healthy  Get these tests  Blood pressure- Have your blood pressure checked once a year by your healthcare provider.  Normal blood pressure is 120/80.  Weight- Have your body mass index (BMI) calculated to screen for obesity.  BMI is a measure of body fat based on height and weight. You can also calculate your own BMI at [employer]velB[employer].it.  Cholesterol- Have your cholesterol checked regularly starting at age 105, sooner may be necessary if you have diabetes, high blood pressure, if a family member developed heart diseases at an early age or if you smoke.   Chlamydia, HIV, and other sexual transmitted disease- Get screened each year until the age of 27 then within three months of each new sexual partner.  Diabetes- Have your blood sugar checked regularly if you have high blood pressure, high cholesterol, a family history of diabetes or if you are  overweight.  Get these vaccines  Flu shot- Every fall.  Tetanus shot- Every 10 years.  Menactra- Single dose; prevents meningitis.  Take these steps  Don't smoke- If you do smoke, ask your healthcare provider about quitting. For tips on how to quit, go to www.smokefree.gov or call 1-800-QUIT-NOW.  Be physically active- Exercise 5 days a week for  at least 30 minutes.  If you are not already physically active start slow and gradually work up to 30 minutes of moderate physical activity.  Examples of moderate activity include walking briskly, mowing the yard, dancing, swimming bicycling, etc.  Eat a healthy diet- Eat a variety of healthy foods such as fruits, vegetables, low fat milk, low fat cheese, yogurt, lean meats, poultry, fish, beans, tofu, etc.  For more information on healthy eating, go to www.thenutritionsource.org  Drink alcohol in moderation- Limit alcohol intake two drinks or less a day.  Never drink and drive.  Dentist- Brush and floss teeth twice daily; visit your dentis twice a year.  Depression-Your emotional health is as important as your physical health.  If you're feeling down, losing interest in things you normally enjoy please talk with your healthcare provider.  Gun Safety- If you keep a gun in your home, keep it unloaded and with the safety lock on.  Bullets should be stored separately.  Helmet use- Always wear a helmet when riding a motorcycle, bicycle, rollerblading or skateboarding.  Safe sex- If you may be exposed to a sexually transmitted infection, use a condom  Seat belts- Seat bels can save your life; always wear one.  Smoke/Carbon Monoxide detectors- These detectors need to be installed on the appropriate level of your home.  Replace batteries at least once a year.  Skin Cancer- When out in the sun, cover up and use sunscreen SPF 15 or higher.  Violence- If anyone is threatening or hurting you, please tell your healthcare provider.

## 2014-10-11 LAB — CULTURE, GROUP A STREP: Organism ID, Bacteria: NORMAL

## 2014-10-31 ENCOUNTER — Ambulatory Visit: Payer: Self-pay | Admitting: Internal Medicine

## 2014-11-07 NOTE — Progress Notes (Signed)
Patient ID: Lonnie EmmerGaurav Kruszka, male   DOB: 04/16/1971, 44 y.o.   MRN: [id number]   44 y.o. referred by Dr Chilton SiGreen for abnormal ECG and family history of CAD  Longstanding RX HTN on calcium blocker.  DM just started on glucophage by primary 12/15  A1c 7.3  LDL 114  He tells me he is trying to change diet and has not started glucophage.  No chest pain dyspnea palpitations or syncope.  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  One 42 yo daughter at [employer].  Denies excesss ETOH or smoking Does take BP pill  Rx last 4 years.  Has not had recent stress test  He cannot specify what kind of heart problem his father had    ROS: Denies fever, malais, weight loss, blurry vision, decreased visual acuity, cough, sputum, SOB, hemoptysis, pleuritic pain, palpitaitons, heartburn, abdominal pain, melena, lower extremity edema, claudication, or rash.  All other systems reviewed and negative   General: Affect appropriate Healthy:  appears stated age HEENT: normal Neck supple with no adenopathy JVP normal no bruits no thyromegaly Lungs clear with no wheezing and good diaphragmatic motion Heart:  S1/S2 no murmur,rub, gallop or click PMI normal Abdomen: benighn, BS positve, no tenderness, no AAA no bruit.  No HSM or HJR Distal pulses intact with no bruits No edema Neuro non-focal Skin warm and dry No muscular weakness  Medications Current Outpatient Prescriptions  Medication Sig Dispense Refill  . amLODipine (NORVASC) 10 MG tablet Take 1 tablet (10 mg total) by mouth daily. 90 tablet 1  . metFORMIN (GLUCOPHAGE) 500 MG tablet Take 1 tablet (500 mg total) by mouth daily with breakfast. 30 tablet 1   No current facility-administered medications for this visit.    Allergies Review of patient's allergies indicates no known allergies.  Family History: Family History  Problem Relation Age of Onset  . Diabetes Mother   . Hypertension Father     Social History: History   Social  History  . Marital Status: Married    Spouse Name: N/A    Number of Children: N/A  . Years of Education: N/A   Occupational History  . Not on file.   Social History Main Topics  . Smoking status: Never Smoker   . Smokeless tobacco: Not on file  . Alcohol Use: Yes  . Drug Use: No  . Sexual Activity: Yes   Other Topics Concern  . Not on file   Social History Narrative    No past surgical history on file.  Past Medical History  Diagnosis Date  . Hypertension     Electrocardiogram:  12/15  SR rate 67 LAE nonspecific ST changes  Assessment and Plan

## 2014-11-09 ENCOUNTER — Ambulatory Visit (INDEPENDENT_AMBULATORY_CARE_PROVIDER_SITE_OTHER): Payer: 59 | Admitting: Cardiovascular Disease

## 2014-11-09 ENCOUNTER — Inpatient Hospital Stay: Admission: RE | Admit: 2014-11-09 | Payer: 59 | Source: Ambulatory Visit

## 2014-11-09 ENCOUNTER — Encounter: Payer: Self-pay | Admitting: Cardiovascular Disease

## 2014-11-09 VITALS — BP 122/70 | HR 70 | Ht 68.0 in | Wt 174.0 lb

## 2014-11-09 DIAGNOSIS — R739 Hyperglycemia, unspecified: Secondary | ICD-10-CM

## 2014-11-09 DIAGNOSIS — R7309 Other abnormal glucose: Secondary | ICD-10-CM

## 2014-11-09 DIAGNOSIS — Z8249 Family history of ischemic heart disease and other diseases of the circulatory system: Secondary | ICD-10-CM

## 2014-11-09 DIAGNOSIS — I10 Essential (primary) hypertension: Secondary | ICD-10-CM

## 2014-11-09 DIAGNOSIS — R9431 Abnormal electrocardiogram [ECG] [EKG]: Secondary | ICD-10-CM

## 2014-11-09 NOTE — Assessment & Plan Note (Signed)
Well controlled.  Continue current medications and low sodium Dash type diet.    

## 2014-11-09 NOTE — Patient Instructions (Signed)
Will arrange CT cardiac scoring today  Your physician wants you to follow-up in: 1 year  You will receive a reminder letter in the mail two months in advance. If you don't receive a letter, please call our office to schedule the follow-up appointment.

## 2014-11-09 NOTE — Assessment & Plan Note (Signed)
Discussed low carb diet and need to f/u A1c in 2-3 months with primary

## 2014-11-09 NOTE — Assessment & Plan Note (Signed)
Nonspecific ST changes Would favor stress echo in future if stress testing needed

## 2014-11-09 NOTE — Assessment & Plan Note (Signed)
Discussed traditional risk factors and need to Rx BP  Also discussed not ignoring BS.  Best test for him would be coronary calcium score  He is claustrophobic but CT should be fine.  No indication for stress testing He will let us know if he wants to proceed as CS is $150 out of pocket expense

## 2014-11-21 ENCOUNTER — Encounter: Payer: Self-pay | Admitting: *Deleted

## 2014-11-21 ENCOUNTER — Encounter: Payer: 59 | Attending: Family Medicine | Admitting: *Deleted

## 2014-11-21 VITALS — Ht 68.0 in | Wt 177.1 lb

## 2014-11-21 DIAGNOSIS — E119 Type 2 diabetes mellitus without complications: Secondary | ICD-10-CM | POA: Diagnosis not present

## 2014-11-21 DIAGNOSIS — Z713 Dietary counseling and surveillance: Secondary | ICD-10-CM | POA: Insufficient documentation

## 2014-11-21 NOTE — Progress Notes (Signed)
Diabetes Self-Management Education    Appt. Start Time: 0800 Appt. End Time: 0930  11/21/2014  Mr. Fleeta EmmerGaurav Richardson, identified by name and date of birth, is a 44 y.o. male with a diagnosis of Diabetes: Type 2.  Other people present during visit:  Lonnie Richardson . Mr. Lonnie BravoGaurav is from [place]. He is a vegetarian.   ASSESSMENT  Height 5\' 8"  (1.727 m), weight 177 lb 1.6 oz (80.332 kg). Body mass index is 26.93 kg/(m^2).  Initial Visit Information:  Are you currently following a meal plan?: No Are you taking your medications as prescribed?: No (Declined to start Metformin) Are you checking your feet?: No How often do you need to have someone help you when you read instructions, pamphlets, or other written materials from your doctor or pharmacy?: 1 - Never   Psychosocial:   Self-care barriers: None Self-management support: Doctor's office, CDE visits Other persons present: Lonnie Richardson Lonnie Richardson Concerns: Nutrition/Meal planning, Healthy Lifestyle, Glycemic Control Special Needs: None Preferred Learning Style: No preference indicated Learning Readiness: Change in progress  Complications:   Last HgB A1C per Lonnie Richardson/outside source: 7.3 mg/dL How often do you check your blood sugar?: Not recommended by provider Have you had a dilated eye exam in the past 12 months?: No Have you had a dental exam in the past 12 months?: No  Diet Intake:  Breakfast: hot tea/milk, wheat bread 1-2 slices almond butter,  oatmeal, 1/2 tsp brown sugar /almonds Snack (morning): none Lunch: veggie patty/black bean, potatoes/bread, beans Snack (afternoon): 4-6pm hot tea/crackers or piece of fruit Dinner: none Snack (evening): rare Beverage(s): cocktail 1-2X week  Exercise:  Exercise: ADL's  Individualized Plan for Diabetes Self-Management Training:   Learning Objective:  Lonnie Richardson will have a greater understanding of diabetes self-management. Lonnie Richardson education plan per assessed needs and concerns is to attend individual  sessions     Education Topics Reviewed with Lonnie Richardson Today:  Definition of diabetes, type 1 and 2, and the diagnosis of diabetes, Factors that contribute to the development of diabetes, Explored Lonnie Richardson's options for treatment of their diabetes Role of diet in the treatment of diabetes and the relationship between the three main macronutrients and blood glucose level, Food label reading, portion sizes and measuring food., Carbohydrate counting, Information on hints to eating out and maintain blood glucose control., Meal options for control of blood glucose level and chronic complications. Role of exercise on diabetes management, blood pressure control and cardiac health., Helped Lonnie Richardson identify appropriate exercises in relation to his/her diabetes, diabetes complications and other health issue. Reviewed patients medication for diabetes, action, purpose, timing of dose and side effects., Other (comment) (declined taking Metformin at this time. wants to see if behavior modification will control glucose)  Relationship between chronic complications and blood glucose control, Dental care, Assessed and discussed foot care and prevention of foot problems, Reviewed with Lonnie Richardson heart disease, higher risk of, and prevention   PATIENTS GOALS/Plan (Developed by the Lonnie Richardson):  Nutrition: General guidelines for healthy choices and portions discussed Physical Activity: Exercise 3-5 times per week  Lonnie Richardson Instructions  Plan:  Aim for 3 Carb Choices per meal (45 grams) +/- 1 either way  Eat three meals per day. Do not skip dinner Aim for 0-15 Carbs per snack if hungry  Include protein in moderation with your meals and snacks Consider reading food labels for Total Carbohydrate and Fat Grams of foods Consider  increasing your activity level by exercising for 30 minutes daily as tolerated Take medication as directed by MD  ALWAYS HAVE  PROTEIN WITH CARBOHYDRATES Consider [facility] Protein Bars for  [employer] Snacks are OK, piece of fruit and some nuts Crackers and almond butter  Decrease your carbohydrates and increase your vegetables  Expected Outcomes:  Demonstrated interest in learning. Expect positive outcomes  Education material provided: Living Well with Diabetes, A1C conversion sheet, Meal plan card and My Plate  If problems or questions, Lonnie Richardson to contact team via:  Phone  Future DSME appointment: PRN

## 2014-11-21 NOTE — Patient Instructions (Addendum)
Plan:  Aim for 3 Carb Choices per meal (45 grams) +/- 1 either way  Eat three meals per day. Do not skip dinner Aim for 0-15 Carbs per snack if hungry  Include protein in moderation with your meals and snacks Consider reading food labels for Total Carbohydrate and Fat Grams of foods Consider  increasing your activity level by exercising for 30 minutes daily as tolerated Take medication as directed by MD  ALWAYS HAVE PROTEIN WITH CARBOHYDRATES Consider [facility] Protein Bars for [employer]fast/easy snack Snacks are OK, piece of fruit and some nuts Crackers and almond butter  Decrease your carbohydrates and increase your vegetables

## 2014-11-27 ENCOUNTER — Encounter: Payer: 59 | Admitting: Family Medicine

## 2014-11-27 NOTE — Progress Notes (Signed)
This encounter was created in error - please disregard.

## 2015-01-29 ENCOUNTER — Ambulatory Visit: Payer: Self-pay | Admitting: Family Medicine

## 2015-02-01 ENCOUNTER — Encounter: Payer: Self-pay | Admitting: Family Medicine

## 2015-02-01 ENCOUNTER — Ambulatory Visit (INDEPENDENT_AMBULATORY_CARE_PROVIDER_SITE_OTHER): Payer: 59 | Admitting: Family Medicine

## 2015-02-01 VITALS — BP 143/90 | HR 78 | Temp 98.7°F | Resp 16 | Ht 67.75 in | Wt 171.4 lb

## 2015-02-01 DIAGNOSIS — E119 Type 2 diabetes mellitus without complications: Secondary | ICD-10-CM

## 2015-02-01 DIAGNOSIS — I10 Essential (primary) hypertension: Secondary | ICD-10-CM | POA: Diagnosis not present

## 2015-02-01 LAB — POCT GLYCOSYLATED HEMOGLOBIN (HGB A1C): Hemoglobin A1C: 6.5

## 2015-02-01 LAB — GLUCOSE, POCT (MANUAL RESULT ENTRY): POC Glucose: 153 mg/dl — AB (ref 70–99)

## 2015-02-01 MED ORDER — GLUCOSE BLOOD VI STRP
ORAL_STRIP | Status: DC
Start: 2015-02-01 — End: 2015-11-13

## 2015-02-01 MED ORDER — ONETOUCH ULTRA SYSTEM W/DEVICE KIT: 1.0000 | PACK | Freq: Once | Status: DC

## 2015-02-01 MED ORDER — AMLODIPINE BESYLATE 10 MG PO TABS: 10.0000 mg | ORAL_TABLET | Freq: Every day | ORAL | Status: DC

## 2015-02-01 MED ORDER — LISINOPRIL 5 MG PO TABS: 5.0000 mg | ORAL_TABLET | Freq: Every day | ORAL | Status: DC

## 2015-02-01 NOTE — Progress Notes (Signed)
Subjective:  This chart was scribed for [name], MD by Molli Posey, Medical scribe. This patient was seen in ROOM 28 and the patient's care was started 4:30 PM.   Patient ID: Lonnie Richardson, male    DOB: 1971/03/28, 44 y.o.   MRN: [id number]   Chief Complaint  Patient presents with  . Hypertension   HPI  HPI Comments: Lonnie Richardson is a 44 y.o. male with a history of HTN who presents to [facility] for a follow up for his HTN. He says that he is feeling well currently and has adjusted his diet. Patient's weight was 178 in 12/15 to 171lbs today. Pt denies light headedness, nausea, vomiting, abdominal pain, CP, dizziness or HAs.   Hypertension:  His last office visit was 10/09/14 and a questionable EKG at that visit and a dx of DM. Pt was referred to cardiology, per cardiology note that these were non-specific ST changes and recommended cornary calcium score but no other indication for stress testing at that time. He states he did not receive the coronary calcium score. Pt reports a family history of heart disease. Last renal function was normal in December. He takes norvasc 39m q.d for blood pressure. Pt states he does not take his blood pressure at home but had it check 1 month ago and was 121/80.   Hyperlipidemia:  LDL was 114 in December of 2015. Based on those readings his 10 year ASCBD risk was 2.4%.   Diabetes: Diagnosed in December with A1C of 7.3. Reccommended metformin [id number] and referred to [facility] nutrition [facility] for classes. Reccommended home glucose monitoring and to follow up with me in 3 weeks. Pt states he has not been taking the metformin medication and is trying to control his DM with diet. He states he went to 2 classes with the dietician.   Patient Active Problem List   Diagnosis Date Noted  . Family history of early CAD 11/09/2014  . Elevated blood sugar 11/09/2014  . Abnormal ECG 11/09/2014  . HTN (hypertension) 01/14/2012   Past  Medical History  Diagnosis Date  . Hypertension   . Diabetes mellitus without complication    Past Surgical History  Procedure Laterality Date  . None     No Known Allergies Prior to Admission medications   Medication Sig Start Date End Date Taking? Authorizing Provider  amLODipine (NORVASC) 10 MG tablet Take 1 tablet (10 mg total) by mouth daily. 10/09/14 10/09/15 Yes JeWendie AgresteMD   History   Social History  . Marital Status: Married    Spouse Name: N/A  . Number of Children: N/A  . Years of Education: N/A   Occupational History  . Not on file.   Social History Main Topics  . Smoking status: Never Smoker   . Smokeless tobacco: Not on file  . Alcohol Use: Yes  . Drug Use: No  . Sexual Activity: Yes   Other Topics Concern  . Not on file   Social History Narrative   Review of Systems  Constitutional: Negative for fatigue and unexpected weight change.  Eyes: Negative for visual disturbance.  Respiratory: Negative for cough, chest tightness and shortness of breath.   Cardiovascular: Negative for chest pain, palpitations and leg swelling.  Gastrointestinal: Negative for nausea, vomiting, abdominal pain and blood in stool.  Neurological: Negative for dizziness, light-headedness and headaches.      Objective:   Physical Exam  Constitutional: He is oriented to person, place, and time. He appears  well-developed and well-nourished.  HENT:  Head: Normocephalic and atraumatic.  Eyes: EOM are normal. Pupils are equal, round, and reactive to light.  Neck: No JVD present. Carotid bruit is not present.  Cardiovascular: Normal rate, regular rhythm and normal heart sounds.   No murmur heard. Pulmonary/Chest: Effort normal and breath sounds normal. He has no rales.  Musculoskeletal: He exhibits no edema.  Neurological: He is alert and oriented to person, place, and time.  Skin: Skin is warm and dry.  Psychiatric: He has a normal mood and affect.  Nursing note and  vitals reviewed.  Filed Vitals:   02/01/15 1618  BP: 145/89  Pulse: 78  Temp: 98.7 F (37.1 C)  TempSrc: Oral  Resp: 16  Height: 5' 7.75" (1.721 m)  Weight: 171 lb 6.4 oz (77.747 kg)  SpO2: 100%   Results for orders placed or performed in visit on 02/01/15  POCT glucose (manual entry)  Result Value Ref Range   POC Glucose 153 (A) 70 - 99 mg/dl  POCT glycosylated hemoglobin (Hb A1C)  Result Value Ref Range   Hemoglobin A1C 6.5        Assessment & Plan:   Lonnie Richardson is a 44 y.o. male Essential hypertension - Plan: lisinopril (PRINIVIL,ZESTRIL) 5 MG tablet, amLODipine (NORVASC) 10 MG tablet  - elevated above goal.  Ok to contineu same dose norvasc, but if outside BP's remain over 140/90, start lisinopril 20m Qd. Plan on lytes/labs at next ov in 3 months.   Type 2 diabetes mellitus without complication - Plan: glucose blood test strip, Blood Glucose Monitoring Suppl (ONE TOUCH ULTRA SYSTEM KIT) W/DEVICE KIT, POCT glucose (manual entry), POCT glycosylated hemoglobin (Hb A1C)  - improved. Continue diet changes and add exercise. Bring home blood sugar readings to next ov.  New meter rx given.   Meds ordered this encounter  Medications  . glucose blood test strip    Sig: Use as instructed    Dispense:  100 each    Refill:  4  . Blood Glucose Monitoring Suppl (ONE TOUCH ULTRA SYSTEM KIT) W/DEVICE KIT    Sig: 1 kit by Does not apply route once.    Dispense:  1 each    Refill:  0    Glucometer of choice with  lancets - once per day testing. QS for 1 month, 11 refills.  .Marland Kitchenlisinopril (PRINIVIL,ZESTRIL) 5 MG tablet    Sig: Take 1 tablet (5 mg total) by mouth daily.    Dispense:  90 tablet    Refill:  1  . amLODipine (NORVASC) 10 MG tablet    Sig: Take 1 tablet (10 mg total) by mouth daily.    Dispense:  90 tablet    Refill:  1   Patient Instructions  Keep a record of your blood pressures outside of the office and bring them to the next office visit.  If they are  continuing to be over 140/90 then start the lisinopril 562monce per day (take this in addition to your amlodipine).  Keep watching diet, but walking for exercise can also help to control diabetes. Check your blood sugars every few days and bring record to next visit (fasting, 2 hours after a meal, or bedtime would be best times).  Recheck in 3 months for cholesterol testing (fasting for 8 hours prior).  Return to the clinic or go to the nearest emergency room if any of your symptoms worsen or new symptoms occur.      I personally performed  the services described in this documentation, which was scribed in my presence. The recorded information has been reviewed and considered, and addended by me as needed.

## 2015-02-01 NOTE — Patient Instructions (Signed)
Keep a record of your blood pressures outside of the office and bring them to the next office visit.  If they are continuing to be over 140/90 then start the lisinopril 5mg  once per day (take this in addition to your amlodipine).  Keep watching diet, but walking for exercise can also help to control diabetes. Check your blood sugars every few days and bring record to next visit (fasting, 2 hours after a meal, or bedtime would be best times).  Recheck in 3 months for cholesterol testing (fasting for 8 hours prior).  Return to the clinic or go to the nearest emergency room if any of your symptoms worsen or new symptoms occur.

## 2015-08-11 ENCOUNTER — Ambulatory Visit (INDEPENDENT_AMBULATORY_CARE_PROVIDER_SITE_OTHER): Payer: 59 | Admitting: Family Medicine

## 2015-08-11 VITALS — BP 149/92 | HR 70 | Temp 98.2°F | Resp 18 | Ht 67.5 in | Wt 173.4 lb

## 2015-08-11 DIAGNOSIS — Z7189 Other specified counseling: Secondary | ICD-10-CM

## 2015-08-11 DIAGNOSIS — E785 Hyperlipidemia, unspecified: Secondary | ICD-10-CM

## 2015-08-11 DIAGNOSIS — R7302 Impaired glucose tolerance (oral): Secondary | ICD-10-CM

## 2015-08-11 DIAGNOSIS — I10 Essential (primary) hypertension: Secondary | ICD-10-CM | POA: Diagnosis not present

## 2015-08-11 DIAGNOSIS — R059 Cough, unspecified: Secondary | ICD-10-CM

## 2015-08-11 DIAGNOSIS — R05 Cough: Secondary | ICD-10-CM | POA: Diagnosis not present

## 2015-08-11 DIAGNOSIS — E119 Type 2 diabetes mellitus without complications: Secondary | ICD-10-CM | POA: Diagnosis not present

## 2015-08-11 LAB — POCT CBC
Granulocyte percent: 65.8 %G (ref 37–80)
HEMATOCRIT: 45.8 % (ref 43.5–53.7)
Hemoglobin: 15.4 g/dL (ref 14.1–18.1)
Lymph, poc: 2 (ref 0.6–3.4)
MCH, POC: 29.4 pg (ref 27–31.2)
MCHC: 33.8 g/dL (ref 31.8–35.4)
MCV: 87 fL (ref 80–97)
MID (cbc): 0.2 (ref 0–0.9)
MPV: 7.5 fL (ref 0–99.8)
POC Granulocyte: 4.3 (ref 2–6.9)
POC LYMPH PERCENT: 30.8 %L (ref 10–50)
POC MID %: 3.4 %M (ref 0–12)
Platelet Count, POC: 341 10*3/uL (ref 142–424)
RBC: 5.26 M/uL (ref 4.69–6.13)
RDW, POC: 14.2 %
WBC: 6.5 10*3/uL (ref 4.6–10.2)

## 2015-08-11 LAB — HEMOGLOBIN A1C: HEMOGLOBIN A1C: 7.6 % — AB (ref 4.0–6.0)

## 2015-08-11 LAB — POCT GLYCOSYLATED HEMOGLOBIN (HGB A1C): Hemoglobin A1C: 7.6

## 2015-08-11 LAB — GLUCOSE, POCT (MANUAL RESULT ENTRY): POC Glucose: 108 mg/dl — AB (ref 70–99)

## 2015-08-11 MED ORDER — BLOOD GLUCOSE METER KIT: PACK | Status: DC

## 2015-08-11 MED ORDER — AMLODIPINE BESYLATE 10 MG PO TABS: 10.0000 mg | ORAL_TABLET | Freq: Every day | ORAL | Status: DC

## 2015-08-11 MED ORDER — LISINOPRIL 5 MG PO TABS: 5.0000 mg | ORAL_TABLET | Freq: Every day | ORAL | Status: DC

## 2015-08-11 NOTE — Patient Instructions (Signed)
Return in the next 1-2 weeks for fasting cholesterol tests. Once I receive the results of your labs, I will let you know within a week to 10 days. Check your blood sugars at home either fasting, 2 hours after a meal, or at bedtime. Check this once every few days. If your numbers are in the high 100s or into 200s, start metformin as we discussed. Otherwise can try to watch diet, and start exercise with recheck of your average blood sugar in 3 months. Your blood pressure was still slightly elevated, so start the lisinopril 5 mg pill once per day as prescribed last visit. If any new side effects on this medicine return to discuss further. For your cough, your exam is reassuring today. You can try over-the-counter Claritin once per day to see if this is from allergies. If the cough persists, return for recheck.

## 2015-08-11 NOTE — Progress Notes (Signed)
Patient ID: Lonnie Richardson, male   DOB: 07/18/1971, 44 y.o.   MRN: [id number]   08/11/2015 at 1:04 PM  Lonnie Richardson / DOB: 03/02/1971 / MRN: [id number]  Problem list reviewed and updated by me where necessary.   SUBJECTIVE  Lonnie Richardson is a 44 y.o. well appearing male presenting for the chief complaint of medication refill. He has HTN and glucose intolerance by chart review..     He  has a past medical history of Hypertension and Diabetes mellitus without complication (HCC).    Medications reviewed and updated by myself where necessary, and exist elsewhere in the encounter.   Lonnie Richardson has No Known Allergies. He  reports that he has never smoked. He does not have any smokeless tobacco history on file. He reports that he drinks alcohol. He reports that he does not use illicit drugs. He  reports that he currently engages in sexual activity. The patient  has past surgical history that includes none.  His family history includes Diabetes in his mother; Hypertension in his father.  Review of Systems  Constitutional: Negative for fever.  Respiratory: Negative for shortness of breath.   Cardiovascular: Negative for chest pain.  Gastrointestinal: Negative for nausea.  Skin: Negative for rash.  Neurological: Negative for dizziness and headaches.    OBJECTIVE  His  height is 5' 7.5" (1.715 m) and weight is 173 lb 6 oz (78.642 kg). His oral temperature is 98.2 F (36.8 C). His blood pressure is 149/92 and his pulse is 70. His respiration is 18 and oxygen saturation is 99%.  The patient's body mass index is 26.74 kg/(m^2).  Physical Exam  Constitutional: He is oriented to person, place, and time. He appears well-developed and well-nourished. No distress.  HENT:  Head: Normocephalic.  Nose: Nose normal.  Eyes: Conjunctivae and EOM are normal.  Respiratory: Effort normal.  Neurological: He is alert and oriented to person, place, and time. He exhibits normal muscle tone.  Coordination normal.  Psychiatric: He has a normal mood and affect.    No results found for this or any previous visit (from the past 24 hour(s)).  ASSESSMENT & PLAN  There are no diagnoses linked to this encounter.

## 2015-08-11 NOTE — Progress Notes (Signed)
Subjective:    Patient ID: Lonnie Richardson, male    DOB: 26-Mar-1971, 44 y.o.   MRN: [id number]  HPI Lonnie Richardson is a 44 y.o. male   Here for follow up.  Hx of HTN, Diabetes.   Hypertension: Borderline/elevated readings last visit, gave option of starting lisinopril 5 mg daily in addition to his usual dose of amlodipine 10 mg daily. No home readings, but did not start this medicine. No chest pains, dyspnea  -10/09/14: questionable EKG, referred to cardiology, per cardiology note that these were non-specific ST changes and recommended cornary calcium score but no other indication for stress testing at that time. No further concerns - no chest pain or dyspnea with activity - no regular exercise outside of work due to long days at work. Occasional dry cough, not daily. No known hx of allergies or heartburn. FH of heart disease - father at 23yo noted to have heart disease - but no MI/PTCA or CABG.   Diabetes/hyperglycemia.  Initial diagnosis of diabetes in December 2015 with hemoglobin A1c of 7.3. Initial discussion for metformin 500 mg daily, but with diet changes he was able to control this with diet only. He did complete diabetes/nutrition classes.Last A1c in 4/16 6.5, with weight 171.  No regular exercise. No changes in diet, just avoiding added sugar.  Not taking metformin. Not checking home blood sugars.   Filed Weights   08/11/15 1259  Weight: 173 lb 6 oz (78.642 kg)    Hyperlipidemia: Lab Results  Component Value Date   CHOL 176 10/09/2014   HDL 43 10/09/2014   LDLCALC 114* 10/09/2014   TRIG 93 10/09/2014   CHOLHDL 4.1 10/09/2014  last po 5 hours ago.   Patient Active Problem List   Diagnosis Date Noted  . Family history of early CAD 11/09/2014  . Elevated blood sugar 11/09/2014  . Abnormal ECG 11/09/2014  . HTN (hypertension) 01/14/2012   Past Medical History  Diagnosis Date  . Hypertension   . Diabetes mellitus without complication [facility])    Past Surgical  History  Procedure Laterality Date  . None     No Known Allergies Prior to Admission medications   Medication Sig Start Date End Date Taking? Authorizing Provider  amLODipine (NORVASC) 10 MG tablet Take 1 tablet (10 mg total) by mouth daily. 02/01/15 02/01/16 Yes Wendie Agreste, MD  Blood Glucose Monitoring Suppl (ONE TOUCH ULTRA SYSTEM KIT) W/DEVICE KIT 1 kit by Does not apply route once. 02/01/15  Yes Wendie Agreste, MD  glucose blood test strip Use as instructed 02/01/15  Yes Wendie Agreste, MD  lisinopril (PRINIVIL,ZESTRIL) 5 MG tablet Take 1 tablet (5 mg total) by mouth daily. Patient not taking: Reported on 08/11/2015 02/01/15   Wendie Agreste, MD   Social History   Social History  . Marital Status: Married    Spouse Name: N/A  . Number of Children: N/A  . Years of Education: N/A   Occupational History  . Not on file.   Social History Main Topics  . Smoking status: Never Smoker   . Smokeless tobacco: Not on file  . Alcohol Use: Yes  . Drug Use: No  . Sexual Activity: Yes   Other Topics Concern  . Not on file   Social History Narrative     Review of Systems  Constitutional: Negative for fatigue and unexpected weight change.  Eyes: Negative for visual disturbance.  Respiratory: Negative for cough, chest tightness and shortness of breath.   Cardiovascular:  Negative for chest pain, palpitations and leg swelling.  Gastrointestinal: Negative for abdominal pain and blood in stool.  Neurological: Negative for dizziness, light-headedness and headaches.       Objective:   Physical Exam  Constitutional: He is oriented to person, place, and time. He appears well-developed and well-nourished.  HENT:  Head: Normocephalic and atraumatic.  Eyes: EOM are normal. Pupils are equal, round, and reactive to light.  Neck: No JVD present. Carotid bruit is not present.  Cardiovascular: Normal rate, regular rhythm and normal heart sounds.   No murmur heard. Pulmonary/Chest: Effort  normal and breath sounds normal. He has no rales.  Musculoskeletal: He exhibits no edema.  Neurological: He is alert and oriented to person, place, and time.  Skin: Skin is warm and dry.  Psychiatric: He has a normal mood and affect.  Vitals reviewed.  Filed Vitals:   08/11/15 1259  BP: 149/92  Pulse: 70  Temp: 98.2 F (36.8 C)  TempSrc: Oral  Resp: 18  Height: 5' 7.5" (1.715 m)  Weight: 173 lb 6 oz (78.642 kg)  SpO2: 99%   Results for orders placed or performed in visit on 08/11/15  POCT glucose (manual entry)  Result Value Ref Range   POC Glucose 108 (A) 70 - 99 mg/dl  POCT glycosylated hemoglobin (Hb A1C)  Result Value Ref Range   Hemoglobin A1C 7.6   POCT CBC  Result Value Ref Range   WBC 6.5 4.6 - 10.2 K/uL   Lymph, poc 2.0 0.6 - 3.4   POC LYMPH PERCENT 30.8 10 - 50 %L   MID (cbc) 0.2 0 - 0.9   POC MID % 3.4 0 - 12 %M   POC Granulocyte 4.3 2 - 6.9   Granulocyte percent 65.8 37 - 80 %G   RBC 5.26 4.69 - 6.13 M/uL   Hemoglobin 15.4 14.1 - 18.1 g/dL   HCT, POC 45.8 43.5 - 53.7 %   MCV 87.0 80 - 97 fL   MCH, POC 29.4 27 - 31.2 pg   MCHC 33.8 31.8 - 35.4 g/dL   RDW, POC 14.2 %   Platelet Count, POC 341 142 - 424 K/uL   MPV 7.5 0 - 99.8 fL          Assessment & Plan:   Tracey Stewart is a 44 y.o. male Type 2 diabetes mellitus without complication, without long-term current use of insulin ([place]) - Plan: POCT glucose (manual entry), POCT glycosylated hemoglobin (Hb A1C), Comprehensive metabolic panel, POCT CBC, blood glucose meter kit and supplies  -Borderline/slightly decreased control today. Plan on checking home blood sugars and if elevated, start metformin 500 mg daily (he has a syndrome). Also can start exercise watching diet, and recheck A1c in 3 months.  Essential hypertension - Plan: POCT glucose (manual entry), POCT glycosylated hemoglobin (Hb A1C), Comprehensive metabolic panel, POCT CBC, amLODipine (NORVASC) 10 MG tablet, lisinopril (PRINIVIL,ZESTRIL)  5 MG tablet  - Not at control. Did not start the lisinopril as discussed last visit. Will start this today. Continue amlodipine 10 mg daily, add lisinopril 5 mg daily.  Encounter for medication review and counseling - Plan: POCT glucose (manual entry), POCT glycosylated hemoglobin (Hb A1C), Comprehensive metabolic panel, POCT CBC  Hyperlipidemia - Plan: Lipid panel  -Not fasting today. Borderline LDL December last year. Return for fasting labs to determine if statin needed.  Cough  -May be allergic. Can try Claritin over-the-counter, return if this persists.  Meds ordered this encounter  Medications  . amLODipine (  NORVASC) 10 MG tablet    Sig: Take 1 tablet (10 mg total) by mouth daily.    Dispense:  90 tablet    Refill:  1  . lisinopril (PRINIVIL,ZESTRIL) 5 MG tablet    Sig: Take 1 tablet (5 mg total) by mouth daily.    Dispense:  90 tablet    Refill:  1  . blood glucose meter kit and supplies    Sig: Dispense based on patient and insurance preference. Use up to four times daily as directed. (FOR ICD-9 250.00, 250.01).    Dispense:  1 each    Refill:  0    Order Specific Question:  Number of strips    Answer:  100    Order Specific Question:  Number of lancets    Answer:  100   Patient Instructions  Return in the next 1-2 weeks for fasting cholesterol tests. Once I receive the results of your labs, I will let you know within a week to 10 days. Check your blood sugars at home either fasting, 2 hours after a meal, or at bedtime. Check this once every few days. If your numbers are in the high 100s or into 200s, start metformin as we discussed. Otherwise can try to watch diet, and start exercise with recheck of your average blood sugar in 3 months. Your blood pressure was still slightly elevated, so start the lisinopril 5 mg pill once per day as prescribed last visit. If any new side effects on this medicine return to discuss further. For your cough, your exam is reassuring today. You  can try over-the-counter Claritin once per day to see if this is from allergies. If the cough persists, return for recheck.

## 2015-08-13 LAB — COMPREHENSIVE METABOLIC PANEL
ALT: 26 U/L (ref 9–46)
AST: 16 U/L (ref 10–40)
Albumin: 4.4 g/dL (ref 3.6–5.1)
Alkaline Phosphatase: 61 U/L (ref 40–115)
BUN: 8 mg/dL (ref 7–25)
CHLORIDE: 102 mmol/L (ref 98–110)
CO2: 26 mmol/L (ref 20–31)
CREATININE: 0.93 mg/dL (ref 0.60–1.35)
Calcium: 9.8 mg/dL (ref 8.6–10.3)
Glucose, Bld: 106 mg/dL — ABNORMAL HIGH (ref 65–99)
Potassium: 4.2 mmol/L (ref 3.5–5.3)
SODIUM: 140 mmol/L (ref 135–146)
TOTAL PROTEIN: 7.5 g/dL (ref 6.1–8.1)
Total Bilirubin: 0.5 mg/dL (ref 0.2–1.2)

## 2015-08-24 ENCOUNTER — Encounter: Payer: Self-pay | Admitting: Family Medicine

## 2015-11-12 NOTE — Progress Notes (Signed)
Patient ID: Rennis PettyGauravkumar Loescher, male   DOB: 07/03/1971, 45 y.o.   MRN: [id number]   45 y.o. referred by Dr Chilton SiGreen January 2016  for abnormal ECG and family history of CAD  Longstanding RX HTN on calcium blocker.  DM  started on glucophage by primary 12/15  A1c 7.3  LDL 114  He tells me he is trying to change diet and has not started glucophage.  No chest pain dyspnea palpitations or syncope.  [place] the [employer] [place] [employer] [place] [address]  Business is good.  One 43 yo daughter at [employer].  Denies excesss ETOH or smoking Does take BP pill  Rx last 4 years.  Has not had recent stress test  He cannot specify what kind of heart problem his father had  Suggested coronary calcium score last visit but patient did not want to have out of pocket cost.   Started on lisinopril by primary 07/2015  But not taking it  Discussed importance  Daughter [name] is Holiday representativeJunior at [employer] Wife [name] also [employer]  Has gas stations in [place]   ROS: Denies fever, malais, weight loss, blurry vision, decreased visual acuity, cough, sputum, SOB, hemoptysis, pleuritic pain, palpitaitons, heartburn, abdominal pain, melena, lower extremity edema, claudication, or rash.  All other systems reviewed and negative   General: Affect appropriate Healthy:  appears stated age HEENT: normal Neck supple with no adenopathy JVP normal no bruits no thyromegaly Lungs clear with no wheezing and good diaphragmatic motion Heart:  S1/S2 no murmur,rub, gallop or click PMI normal Abdomen: benighn, BS positve, no tenderness, no AAA no bruit.  No HSM or HJR Distal pulses intact with no bruits No edema Neuro non-focal Skin warm and dry No muscular weakness  Medications Current Outpatient Prescriptions  Medication Sig Dispense Refill  . amLODipine (NORVASC) 10 MG tablet Take 1 tablet (10 mg total) by mouth daily. 90 tablet 1   No current facility-administered medications for this visit.    Allergies Review of patient's  allergies indicates no known allergies.  Family History: Family History  Problem Relation Age of Onset  . Diabetes Mother   . Hypertension Father     Social History: Social History   Social History  . Marital Status: Married    Spouse Name: N/A  . Number of Children: N/A  . Years of Education: N/A   Occupational History  . Not on file.   Social History Main Topics  . Smoking status: Never Smoker   . Smokeless tobacco: Not on file  . Alcohol Use: Yes  . Drug Use: No  . Sexual Activity: Yes   Other Topics Concern  . Not on file   Social History Narrative    Past Surgical History  Procedure Laterality Date  . None      Past Medical History  Diagnosis Date  . Hypertension   . Diabetes mellitus without complication (HCC)     Electrocardiogram:  12/15  SR rate 67 LAE nonspecific ST changes  Assessment and Plan  HTN: compliance with ACE stressed resume   Discussed importance for renal protection given DM And also benefits to EF  F/U echo in 6 month if compliant  DM: Discussed low carb diet.  Target hemoglobin A1c is 6.5 or less.  Continue current medications. Family history of CAD  Encouraged calcium score no need for stress testing    Charlton HawsPeter Temitope Griffing

## 2015-11-13 ENCOUNTER — Ambulatory Visit (INDEPENDENT_AMBULATORY_CARE_PROVIDER_SITE_OTHER): Payer: BLUE CROSS/BLUE SHIELD | Admitting: Cardiovascular Disease

## 2015-11-13 ENCOUNTER — Encounter: Payer: Self-pay | Admitting: Cardiovascular Disease

## 2015-11-13 VITALS — BP 140/90 | HR 78 | Ht 68.0 in | Wt 180.0 lb

## 2015-11-13 DIAGNOSIS — Z8249 Family history of ischemic heart disease and other diseases of the circulatory system: Secondary | ICD-10-CM

## 2015-11-13 DIAGNOSIS — I422 Other hypertrophic cardiomyopathy: Secondary | ICD-10-CM

## 2015-11-13 DIAGNOSIS — I10 Essential (primary) hypertension: Secondary | ICD-10-CM | POA: Diagnosis not present

## 2015-11-13 DIAGNOSIS — I429 Cardiomyopathy, unspecified: Secondary | ICD-10-CM

## 2015-11-13 NOTE — Patient Instructions (Addendum)
Medication Instructions:  Your physician recommends that you continue on your current medications as directed. Please refer to the Current Medication list given to you today.  Labwork: NONE  Testing/Procedures: Your physician has requested that you have an echocardiogram 6 months and same day as office visit. Echocardiography is a painless test that uses sound waves to create images of your heart. It provides your doctor with information about the size and shape of your heart and how well your heart's chambers and valves are working. This procedure takes approximately one hour. There are no restrictions for this procedure.  Follow-Up: Your physician wants you to follow-up in: 6 months with Dr. Eden Emms. You will receive a reminder letter in the mail two months in advance. If you don't receive a letter, please call our office to schedule the follow-up appointment.   If you need a refill on your cardiac medications before your next appointment, please call your pharmacy.

## 2016-05-29 ENCOUNTER — Encounter: Payer: Self-pay | Admitting: Family Medicine

## 2016-05-29 ENCOUNTER — Ambulatory Visit (INDEPENDENT_AMBULATORY_CARE_PROVIDER_SITE_OTHER): Payer: Self-pay | Admitting: Family Medicine

## 2016-05-29 VITALS — BP 124/68 | HR 80 | Temp 98.3°F | Resp 16 | Ht 67.25 in | Wt 180.6 lb

## 2016-05-29 DIAGNOSIS — I10 Essential (primary) hypertension: Secondary | ICD-10-CM

## 2016-05-29 DIAGNOSIS — E119 Type 2 diabetes mellitus without complications: Secondary | ICD-10-CM

## 2016-05-29 MED ORDER — AMLODIPINE BESYLATE 10 MG PO TABS: 10.0000 mg | ORAL_TABLET | Freq: Every day | ORAL | 1 refills | Status: DC

## 2016-05-29 NOTE — Progress Notes (Signed)
By signing my name below, I, Mesha Guinyard, attest that this documentation has been prepared under the direction and in the presence of [name], MD.  Electronically Signed: Arvilla Market, Medical Scribe. 05/29/16. 1:40 PM.  Subjective:    Patient ID: Lonnie Richardson, male    DOB: [birth year], 45 y.o.   MRN: [id number]  HPI Chief Complaint  Patient presents with  . Medication Refill    Amlodipine    HPI Comments: Lonnie Richardson is a 45 y.o. male who [facility] to the [facility] for medication refill. Pt just ate lunch.  DM: He was advised to start Metformin 500 mg QD if his home reading were elevated. Pt is not checking home blood sugar readings and isn't taking prescribed DM medication. He's seen ophthalmologist in the last year. Pt last saw dentist in the fall. Lab Results  Component Value Date   HGBA1C 7.6 08/11/2015   No results found for: MICROALBUR, [id number]   HTN: Decreased control last visit. He did not start Lisinopril last visit as discussed. We started him on Amlodipine 10 mg QD. He was advised to return for fasting labs. He had borderline LDL previously. This has not been done. Pt is only taking Amlodipine because he ran out of Lisinopril 1 month ago. Current reading is only on Amlodipine. Pt denies chest pain, trouble breathing, SOB Patient Active Problem List   Diagnosis Date Noted  . Family history of early CAD 11/09/2014  . Elevated blood sugar 11/09/2014  . Abnormal ECG 11/09/2014  . HTN (hypertension) 01/14/2012   Past Medical History:  Diagnosis Date  . Diabetes mellitus without complication (HCC)   . Hypertension    Past Surgical History:  Procedure Laterality Date  . none     No Known Allergies Prior to Admission medications   Medication Sig Start Date End Date Taking? Authorizing Provider  amLODipine (NORVASC) 10 MG tablet Take 1 tablet (10 mg total) by mouth daily. 08/11/15 08/10/16 Yes Shade Flood, MD   Social  History   Social History  . Marital status: Married    Spouse name: N/A  . Number of children: N/A  . Years of education: N/A   Occupational History  . Not on file.   Social History Main Topics  . Smoking status: Never Smoker  . Smokeless tobacco: Not on file  . Alcohol use Yes  . Drug use: No  . Sexual activity: Yes   Other Topics Concern  . Not on file   Social History Narrative  . No narrative on file   Review of Systems  Constitutional: Negative for fatigue and unexpected weight change.  Eyes: Negative for visual disturbance.  Respiratory: Negative for cough, chest tightness and shortness of breath.   Cardiovascular: Negative for chest pain, palpitations and leg swelling.  Gastrointestinal: Negative for abdominal pain and blood in stool.  Neurological: Negative for dizziness, light-headedness and headaches.    Objective:  Physical Exam  Constitutional: He is oriented to person, place, and time. He appears well-developed and well-nourished.  HENT:  Head: Normocephalic and atraumatic.  Eyes: EOM are normal. Pupils are equal, round, and reactive to light.  Neck: No JVD present. Carotid bruit is not present.  Cardiovascular: Normal rate, regular rhythm and normal heart sounds.   No murmur heard. Pulmonary/Chest: Effort normal and breath sounds normal. He has no rales.  Musculoskeletal: He exhibits no edema.  Neurological: He is alert and oriented to person, place, and time.  Skin: Skin is  warm and dry.  Psychiatric: He has a normal mood and affect.  Vitals reviewed.  BP 124/68 (BP Location: Left Arm, Patient Position: Sitting, Cuff Size: Normal)   Pulse 80   Temp 98.3 F (36.8 C) (Oral)   Resp 16   Ht 5' 7.25" (1.708 m)   Wt 180 lb 9.6 oz (81.9 kg)   BMI 28.08 kg/m  Assessment & Plan:   Lonnie Richardson is a 45 y.o. male Type 2 diabetes mellitus without complication, without long-term current use of insulin (HCC) - Plan: Hemoglobin A1c  -Off meds. Will  check A1c determine if restart needed. Recommended ophthalmic follow-up  Essential hypertension - Plan: COMPLETE METABOLIC PANEL WITH GFR, amLODipine (NORVASC) 10 MG tablet  -Check labs, continue amlodipine at same dose as overall stable. Consider change to lisinopril, or addition of lisinopril if elevated readings.  Meds ordered this encounter  Medications  . amLODipine (NORVASC) 10 MG tablet    Sig: Take 1 tablet (10 mg total) by mouth daily.    Dispense:  90 tablet    Refill:  1   Patient Instructions   As we discussed, it is very important for your health that you have routine follow-up for diabetes and blood pressure. I will check your blood sugar test today, and will let you know if you need to restart medication. Continue same dose of amlodipine for now, and plan on recheck in 3 months for evaluation of diabetes and fasting labs so we can recheck your cholesterol.   IF you received an x-ray today, you will receive an invoice from [facility]. Please contact [facility] at [phone] with questions or concerns regarding your invoice.   IF you received labwork today, you will receive an invoice from [employer]. Please contact [facility] at [phone] with questions or concerns regarding your invoice.   Our billing staff will not be able to assist you with questions regarding bills from these companies.  You will be contacted with the lab results as soon as they are available. The fastest way to get your results is to activate your My Chart account. Instructions are located on the last page of this paperwork. If you have not heard from [place] regarding the results in 2 weeks, please contact this office.       I personally performed the services described in this documentation, which was scribed in my presence. The recorded information has been reviewed and considered, and addended by me as needed.   Signed,   [name], MD [facility].  05/31/16 11:27 PM

## 2016-05-29 NOTE — Patient Instructions (Addendum)
As we discussed, it is very important for your health that you have routine follow-up for diabetes and blood pressure. I will check your blood sugar test today, and will let you know if you need to restart medication. Continue same dose of amlodipine for now, and plan on recheck in 3 months for evaluation of diabetes and fasting labs so we can recheck your cholesterol.   IF you received an x-ray today, you will receive an invoice from [facility]. Please contact [facility] at [phone] with questions or concerns regarding your invoice.   IF you received labwork today, you will receive an invoice from [employer]. Please contact [facility] at [phone] with questions or concerns regarding your invoice.   Our billing staff will not be able to assist you with questions regarding bills from these companies.  You will be contacted with the lab results as soon as they are available. The fastest way to get your results is to activate your My Chart account. Instructions are located on the last page of this paperwork. If you have not heard from [place] regarding the results in 2 weeks, please contact this office.

## 2016-05-30 LAB — COMPLETE METABOLIC PANEL WITH GFR
ALBUMIN: 4.4 g/dL (ref 3.6–5.1)
ALK PHOS: 49 U/L (ref 40–115)
ALT: 29 U/L (ref 9–46)
AST: 20 U/L (ref 10–40)
BUN: 9 mg/dL (ref 7–25)
CALCIUM: 9.7 mg/dL (ref 8.6–10.3)
CHLORIDE: 101 mmol/L (ref 98–110)
CO2: 25 mmol/L (ref 20–31)
Creat: 0.93 mg/dL (ref 0.60–1.35)
GFR, Est African American: >89 mL/min (ref >=60)
GLUCOSE: 236 mg/dL — AB (ref 65–99)
POTASSIUM: 4.3 mmol/L (ref 3.5–5.3)
SODIUM: 137 mmol/L (ref 135–146)
Total Bilirubin: 0.6 mg/dL (ref 0.2–1.2)
Total Protein: 7.3 g/dL (ref 6.1–8.1)

## 2016-05-30 LAB — HEMOGLOBIN A1C
HEMOGLOBIN A1C: 8.5 % — AB (ref <5.7)
Mean Plasma Glucose: 197 mg/dL

## 2016-05-31 ENCOUNTER — Encounter: Payer: Self-pay | Admitting: Family Medicine

## 2016-06-03 ENCOUNTER — Ambulatory Visit: Payer: BLUE CROSS/BLUE SHIELD | Admitting: Cardiovascular Disease

## 2016-06-03 ENCOUNTER — Other Ambulatory Visit (HOSPITAL_COMMUNITY): Payer: BLUE CROSS/BLUE SHIELD

## 2016-06-11 ENCOUNTER — Other Ambulatory Visit: Payer: Self-pay

## 2016-06-11 MED ORDER — METFORMIN HCL 500 MG PO TABS: 500.0000 mg | ORAL_TABLET | Freq: Every day | ORAL | 0 refills | Status: DC

## 2016-06-11 NOTE — Telephone Encounter (Signed)
Spoke with pt.  Gave msg per Dr. Neva SeatGreene.  Stressed return visit in 3 months.  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

## 2016-06-12 ENCOUNTER — Encounter: Payer: Self-pay | Admitting: Cardiovascular Disease

## 2016-06-23 ENCOUNTER — Encounter: Payer: Self-pay | Admitting: Cardiovascular Disease

## 2016-06-26 ENCOUNTER — Encounter: Payer: Self-pay | Admitting: Cardiovascular Disease

## 2016-07-08 ENCOUNTER — Ambulatory Visit: Payer: BLUE CROSS/BLUE SHIELD | Admitting: Cardiovascular Disease

## 2016-07-08 ENCOUNTER — Other Ambulatory Visit (HOSPITAL_COMMUNITY): Payer: BLUE CROSS/BLUE SHIELD

## 2016-09-04 ENCOUNTER — Ambulatory Visit (INDEPENDENT_AMBULATORY_CARE_PROVIDER_SITE_OTHER): Payer: Self-pay | Admitting: Family Medicine

## 2016-09-04 ENCOUNTER — Encounter: Payer: Self-pay | Admitting: Family Medicine

## 2016-09-04 VITALS — BP 104/80 | HR 72 | Temp 98.4°F | Resp 16 | Ht 67.25 in | Wt 179.0 lb

## 2016-09-04 DIAGNOSIS — Z13220 Encounter for screening for lipoid disorders: Secondary | ICD-10-CM

## 2016-09-04 DIAGNOSIS — I10 Essential (primary) hypertension: Secondary | ICD-10-CM

## 2016-09-04 DIAGNOSIS — J069 Acute upper respiratory infection, unspecified: Secondary | ICD-10-CM

## 2016-09-04 DIAGNOSIS — E119 Type 2 diabetes mellitus without complications: Secondary | ICD-10-CM

## 2016-09-04 LAB — COMPLETE METABOLIC PANEL WITH GFR
ALT: 26 U/L (ref 9–46)
AST: 17 U/L (ref 10–40)
Albumin: 4.5 g/dL (ref 3.6–5.1)
Alkaline Phosphatase: 52 U/L (ref 40–115)
BUN: 9 mg/dL (ref 7–25)
CALCIUM: 9.4 mg/dL (ref 8.6–10.3)
CHLORIDE: 100 mmol/L (ref 98–110)
CO2: 26 mmol/L (ref 20–31)
Creat: 1.02 mg/dL (ref 0.60–1.35)
GFR, Est Non African American: >89 mL/min (ref >=60)
Glucose, Bld: 191 mg/dL — ABNORMAL HIGH (ref 65–99)
POTASSIUM: 4.3 mmol/L (ref 3.5–5.3)
Sodium: 136 mmol/L (ref 135–146)
Total Bilirubin: 0.8 mg/dL (ref 0.2–1.2)
Total Protein: 7.3 g/dL (ref 6.1–8.1)

## 2016-09-04 LAB — MICROALBUMIN, URINE: Microalb, Ur: 1.7 mg/dL

## 2016-09-04 LAB — LIPID PANEL
CHOL/HDL RATIO: 4.6 ratio (ref <5.0)
CHOLESTEROL: 216 mg/dL — AB (ref <200)
HDL: 47 mg/dL (ref >40)
LDL Cholesterol: 146 mg/dL — ABNORMAL HIGH
TRIGLYCERIDES: 113 mg/dL (ref <150)
VLDL: 23 mg/dL (ref <30)

## 2016-09-04 MED ORDER — AMLODIPINE BESYLATE 5 MG PO TABS: 5.0000 mg | ORAL_TABLET | Freq: Every day | ORAL | 1 refills | Status: DC

## 2016-09-04 NOTE — Progress Notes (Signed)
By signing my name below, I, Mesha Guinyard, attest that this documentation has been prepared under the direction and in the presence of [name], MD.  Electronically Signed: Arvilla Market, Medical Scribe. 09/04/16. 8:27 AM.  Subjective:    Patient ID: Lonnie Richardson, male    DOB: 02/20/1971, 45 y.o.   MRN: [id number]  HPI Chief Complaint  Patient presents with  . Follow-up    TIIDM     HPI Comments: Lonnie Richardson is a 45 y.o. male who presents to the [facility] for T2DM follow-up.  DM: Advised to restart metformin as he had been off at that time. Ophtho within the past year. Not compliant with metformin because he made some lifestyle changes such as; eating earlier in the day, cuting back on his his sugar, and exercising 2x a week. Last saw his ophthalmologist last year. Lab Results  Component Value Date   HGBA1C 8.5 (H) 05/29/2016   No results found for: Concepcion [place]   Lab Results  Component Value Date   CHOL 176 10/09/2014   HDL 43 10/09/2014   LDLCALC 114 (H) 10/09/2014   TRIG 93 10/09/2014   CHOLHDL 4.1 10/09/2014    HTN: Amlodipine 10 mg QD, lisinopril had been added at prev visit, but he had been out for 1 month. As reading stable, remain just on amlodipine. Compliant with Amlodipine. Does not take ASA daily. Denies light-headedness, dizziness, or any other side effects while on amlodipine. Lab Results  Component Value Date   CREATININE 0.93 05/29/2016   BP Readings from Last 3 Encounters:  09/04/16 104/80  05/29/16 124/68  11/13/15 140/90   Cough: Reports coughing for the past 4 days with associated symptoms of congestion, and rhinorrhea. Took halls and "yellow powder" for his symptoms. Denies sick contacts. Denies fever, chest pain, and SOB.  Patient Active Problem List   Diagnosis Date Noted  . Family history of early CAD 11/09/2014  . Elevated blood sugar 11/09/2014  . Abnormal ECG 11/09/2014  . HTN  (hypertension) 01/14/2012   Past Medical History:  Diagnosis Date  . Diabetes mellitus without complication (HCC)   . Hypertension    Past Surgical History:  Procedure Laterality Date  . none     No Known Allergies Prior to Admission medications   Medication Sig Start Date End Date Taking? Authorizing Provider  amLODipine (NORVASC) 10 MG tablet Take 1 tablet (10 mg total) by mouth daily. 05/29/16 05/29/17 Yes Shade Flood, MD  metFORMIN (GLUCOPHAGE) 500 MG tablet Take 1 tablet (500 mg total) by mouth daily with breakfast. Patient not taking: Reported on 09/04/2016 06/11/16   Shade Flood, MD   Social History   Social History  . Marital status: Married    Spouse name: N/A  . Number of children: N/A  . Years of education: N/A   Occupational History  . Not on file.   Social History Main Topics  . Smoking status: Never Smoker  . Smokeless tobacco: Never Used  . Alcohol use Yes  . Drug use: No  . Sexual activity: Yes   Other Topics Concern  . Not on file   Social History Narrative  . No narrative on file   Review of Systems  Constitutional: Negative for fatigue, fever and unexpected weight change.  HENT: Positive for congestion and rhinorrhea.   Eyes: Negative for visual disturbance.  Respiratory: Positive for cough. Negative for chest tightness and shortness of breath.   Cardiovascular: Negative for chest pain,  palpitations and leg swelling.  Gastrointestinal: Negative for abdominal pain and blood in stool.  Neurological: Negative for dizziness, light-headedness and headaches.   Objective:  Physical Exam  Constitutional: He is oriented to person, place, and time. He appears well-developed and well-nourished. No distress.  HENT:  Head: Normocephalic and atraumatic.  Right Ear: Tympanic membrane, external ear and ear canal normal.  Left Ear: Tympanic membrane, external ear and ear canal normal.  Nose: No rhinorrhea.  Mouth/Throat: Oropharynx is clear and moist  and mucous membranes are normal. No oropharyngeal exudate or posterior oropharyngeal erythema.  Eyes: Conjunctivae and EOM are normal. Pupils are equal, round, and reactive to light.  Neck: Neck supple. No JVD present. Carotid bruit is not present.  Cardiovascular: Normal rate, regular rhythm, normal heart sounds and intact distal pulses.  Exam reveals no gallop and no friction rub.   No murmur heard. Pulmonary/Chest: Effort normal and breath sounds normal. No respiratory distress. He has no wheezes. He has no rhonchi. He has no rales.  Abdominal: Soft. There is no tenderness.  Musculoskeletal: He exhibits no edema.  Lymphadenopathy:    He has no cervical adenopathy.  Neurological: He is alert and oriented to person, place, and time.  Skin: Skin is warm and dry. No rash noted.  Foot Exam: No skin break down. Microfilament feeling intact.  Psychiatric: He has a normal mood and affect. His behavior is normal.  Nursing note and vitals reviewed.  BP 104/80 (BP Location: Right Arm, Patient Position: Sitting, Cuff Size: Small)   Pulse 72   Temp 98.4 F (36.9 C) (Oral)   Resp 16   Ht 5' 7.25" (1.708 m)   Wt 179 lb (81.2 kg)   SpO2 98%   BMI 27.83 kg/m  Assessment & Plan:    Lonnie PettyGauravkumar Richardson is a 45 y.o. male Type 2 diabetes mellitus without complication, without long-term current use of insulin (HCC) - Plan: Microalbumin, urine  - Repeat A1c, if over 7, start metformin.  -Increase exercise to most days a week, commended on diet changes.  -Check urine microalbumin, lipid panel.  Essential hypertension - Plan: COMPLETE METABOLIC PANEL WITH GFR, amLODipine (NORVASC) 5 MG tablet  -Very well controlled, and on the lower side of normal. We'll try decreasing Norvasc to 5 mg daily, monitor ambulatory blood pressures, and if over 130/80, would restart at 10 mg. CMP pending.  Screening for hyperlipidemia - Plan: COMPLETE METABOLIC PANEL WITH GFR, Lipid panel  -Check lipids, CMP. Discussed with  diabetes likely will need statin, but can determine if high-dose needed based on AHA guidelines.  Acute upper respiratory infection  -Suspected viral syndrome, symptomatic care discussed with Mucinex, others per handout. RTC precautions  Meds ordered this encounter  Medications  . amLODipine (NORVASC) 5 MG tablet    Sig: Take 1 tablet (5 mg total) by mouth daily.    Dispense:  90 tablet    Refill:  1   Patient Instructions   We can decrease your or amlodipine down to 5 mg once per day, but if blood pressures increases over 130/80, restart 10 mg dose. Call me if this is the case and I will send in the new prescription.  Recurrent cough and cold symptoms are likely due to virus. See information on this below. You can use over-the-counter Mucinex as needed for cough, drink plenty of fluids and get rest. If any fevers or shortness of breath, be seen here or other medical provider.  If your blood sugar is still elevated  as it was last time, I would again recommend metformin once per day to lessen possibility of high blood sugars affecting other systems as we discussed. Follow-up in 3 months. Sooner if worse   Cough, Adult Coughing is a reflex that clears your throat and your airways. Coughing helps to heal and protect your lungs. It is normal to cough occasionally, but a cough that happens with other symptoms or lasts a long time may be a sign of a condition that needs treatment. A cough may last only 2-3 weeks (acute), or it may last longer than 8 weeks (chronic). CAUSES Coughing is commonly caused by:  Breathing in substances that irritate your lungs.  A viral or bacterial respiratory infection.  Allergies.  Asthma.  Postnasal drip.  Smoking.  Acid backing up from the stomach into the esophagus (gastroesophageal reflux).  Certain medicines.  Chronic lung problems, including COPD (or rarely, lung cancer).  Other medical conditions such as heart failure. HOME CARE INSTRUCTIONS   Pay attention to any changes in your symptoms. Take these actions to help with your discomfort:  Take medicines only as told by your health care provider.  If you were prescribed an antibiotic medicine, take it as told by your health care provider. Do not stop taking the antibiotic even if you start to feel better.  Talk with your health care provider before you take a cough suppressant medicine.  Drink enough fluid to keep your urine clear or pale yellow.  If the air is dry, use a cold steam vaporizer or humidifier in your bedroom or your home to help loosen secretions.  Avoid anything that causes you to cough at work or at home.  If your cough is worse at night, try sleeping in a semi-upright position.  Avoid cigarette smoke. If you smoke, quit smoking. If you need help quitting, ask your health care provider.  Avoid caffeine.  Avoid alcohol.  Rest as needed. SEEK MEDICAL CARE IF:   You have new symptoms.  You cough up pus.  Your cough does not get better after 2-3 weeks, or your cough gets worse.  You cannot control your cough with suppressant medicines and you are losing sleep.  You develop pain that is getting worse or pain that is not controlled with pain medicines.  You have a fever.  You have unexplained weight loss.  You have night sweats. SEEK IMMEDIATE MEDICAL CARE IF:  You cough up blood.  You have difficulty breathing.  Your heartbeat is very fast.   This information is not intended to replace advice given to you by your health care provider. Make sure you discuss any questions you have with your health care provider.   Document Released: 04/11/2011 Document Revised: 07/04/2015 Document Reviewed: 12/20/2014 Elsevier Interactive Patient Education 2016 Elsevier Inc.  Upper Respiratory Infection, Adult Most upper respiratory infections (URIs) are a viral infection of the air passages leading to the lungs. A URI affects the nose, throat, and upper air  passages. The most common type of URI is nasopharyngitis and is typically referred to as "the common cold." URIs run their course and usually go away on their own. Most of the time, a URI does not require medical attention, but sometimes a bacterial infection in the upper airways can follow a viral infection. This is called a secondary infection. Sinus and middle ear infections are common types of secondary upper respiratory infections. Bacterial pneumonia can also complicate a URI. A URI can worsen asthma and chronic obstructive pulmonary  disease (COPD). Sometimes, these complications can require emergency medical care and may be life threatening.  CAUSES Almost all URIs are caused by viruses. A virus is a type of germ and can spread from one person to another.  RISKS FACTORS You may be at risk for a URI if:   You smoke.   You have chronic heart or lung disease.  You have a weakened defense (immune) system.   You are very young or very old.   You have nasal allergies or asthma.  You work in crowded or poorly ventilated areas.  You work in health care facilities or schools. SIGNS AND SYMPTOMS  Symptoms typically develop 2-3 days after you come in contact with a cold virus. Most viral URIs last 7-10 days. However, viral URIs from the influenza virus (flu virus) can last 14-18 days and are typically more severe. Symptoms may include:   Runny or stuffy (congested) nose.   Sneezing.   Cough.   Sore throat.   Headache.   Fatigue.   Fever.   Loss of appetite.   Pain in your forehead, behind your eyes, and over your cheekbones (sinus pain).  Muscle aches.  DIAGNOSIS  Your health care provider may diagnose a URI by:  Physical exam.  Tests to check that your symptoms are not due to another condition such as:  Strep throat.  Sinusitis.  Pneumonia.  Asthma. TREATMENT  A URI goes away on its own with time. It cannot be cured with medicines, but medicines may  be prescribed or recommended to relieve symptoms. Medicines may help:  Reduce your fever.  Reduce your cough.  Relieve nasal congestion. HOME CARE INSTRUCTIONS   Take medicines only as directed by your health care provider.   Gargle warm saltwater or take cough drops to comfort your throat as directed by your health care provider.  Use a warm mist humidifier or inhale steam from a shower to increase air moisture. This may make it easier to breathe.  Drink enough fluid to keep your urine clear or pale yellow.   Eat soups and other clear broths and maintain good nutrition.   Rest as needed.   Return to work when your temperature has returned to normal or as your health care provider advises. You may need to stay home longer to avoid infecting others. You can also use a face mask and careful hand washing to prevent spread of the virus.  Increase the usage of your inhaler if you have asthma.   Do not use any tobacco products, including cigarettes, chewing tobacco, or electronic cigarettes. If you need help quitting, ask your health care provider. PREVENTION  The best way to protect yourself from getting a cold is to practice good hygiene.   Avoid oral or hand contact with people with cold symptoms.   Wash your hands often if contact occurs.  There is no clear evidence that vitamin C, vitamin E, echinacea, or exercise reduces the chance of developing a cold. However, it is always recommended to get plenty of rest, exercise, and practice good nutrition.  SEEK MEDICAL CARE IF:   You are getting worse rather than better.   Your symptoms are not controlled by medicine.   You have chills.  You have worsening shortness of breath.  You have brown or red mucus.  You have yellow or brown nasal discharge.  You have pain in your face, especially when you bend forward.  You have a fever.  You have swollen neck glands.  You have pain while swallowing.  You have white  areas in the back of your throat. SEEK IMMEDIATE MEDICAL CARE IF:   You have severe or persistent:  Headache.  Ear pain.  Sinus pain.  Chest pain.  You have chronic lung disease and any of the following:  Wheezing.  Prolonged cough.  Coughing up blood.  A change in your usual mucus.  You have a stiff neck.  You have changes in your:  Vision.  Hearing.  Thinking.  Mood. MAKE SURE YOU:   Understand these instructions.  Will watch your condition.  Will get help right away if you are not doing well or get worse.   This information is not intended to replace advice given to you by your health care provider. Make sure you discuss any questions you have with your health care provider.   Document Released: 04/08/2001 Document Revised: 02/27/2015 Document Reviewed: 01/18/2014 Elsevier Interactive Patient Education 2016 [employer].    IF you received an x-ray today, you will receive an invoice from [facility]. Please contact [facility] at [phone] with questions or concerns regarding your invoice.   IF you received labwork today, you will receive an invoice from [employer]. Please contact [facility] at [phone] with questions or concerns regarding your invoice.   Our billing staff will not be able to assist you with questions regarding bills from these companies.  You will be contacted with the lab results as soon as they are available. The fastest way to get your results is to activate your My Chart account. Instructions are located on the last page of this paperwork. If you have not heard from [place] regarding the results in 2 weeks, please contact this office.      I personally performed the services described in this documentation, which was scribed in my presence. The recorded information has been reviewed and considered, and addended by me as needed.   Signed,   [name], MD [facility].  09/04/16 8:56 AM

## 2016-09-04 NOTE — Patient Instructions (Addendum)
We can decrease your or amlodipine down to 5 mg once per day, but if blood pressures increases over 130/80, restart 10 mg dose. Call me if this is the case and I will send in the new prescription.  Recurrent cough and cold symptoms are likely due to virus. See information on this below. You can use over-the-counter Mucinex as needed for cough, drink plenty of fluids and get rest. If any fevers or shortness of breath, be seen here or other medical provider.  If your blood sugar is still elevated as it was last time, I would again recommend metformin once per day to lessen possibility of high blood sugars affecting other systems as we discussed. Follow-up in 3 months. Sooner if worse   Cough, Adult Coughing is a reflex that clears your throat and your airways. Coughing helps to heal and protect your lungs. It is normal to cough occasionally, but a cough that happens with other symptoms or lasts a long time may be a sign of a condition that needs treatment. A cough may last only 2-3 weeks (acute), or it may last longer than 8 weeks (chronic). CAUSES Coughing is commonly caused by:  Breathing in substances that irritate your lungs.  A viral or bacterial respiratory infection.  Allergies.  Asthma.  Postnasal drip.  Smoking.  Acid backing up from the stomach into the esophagus (gastroesophageal reflux).  Certain medicines.  Chronic lung problems, including COPD (or rarely, lung cancer).  Other medical conditions such as heart failure. HOME CARE INSTRUCTIONS  Pay attention to any changes in your symptoms. Take these actions to help with your discomfort:  Take medicines only as told by your health care provider.  If you were prescribed an antibiotic medicine, take it as told by your health care provider. Do not stop taking the antibiotic even if you start to feel better.  Talk with your health care provider before you take a cough suppressant medicine.  Drink enough fluid to keep your  urine clear or pale yellow.  If the air is dry, use a cold steam vaporizer or humidifier in your bedroom or your home to help loosen secretions.  Avoid anything that causes you to cough at work or at home.  If your cough is worse at night, try sleeping in a semi-upright position.  Avoid cigarette smoke. If you smoke, quit smoking. If you need help quitting, ask your health care provider.  Avoid caffeine.  Avoid alcohol.  Rest as needed. SEEK MEDICAL CARE IF:   You have new symptoms.  You cough up pus.  Your cough does not get better after 2-3 weeks, or your cough gets worse.  You cannot control your cough with suppressant medicines and you are losing sleep.  You develop pain that is getting worse or pain that is not controlled with pain medicines.  You have a fever.  You have unexplained weight loss.  You have night sweats. SEEK IMMEDIATE MEDICAL CARE IF:  You cough up blood.  You have difficulty breathing.  Your heartbeat is very fast.   This information is not intended to replace advice given to you by your health care provider. Make sure you discuss any questions you have with your health care provider.   Document Released: 04/11/2011 Document Revised: 07/04/2015 Document Reviewed: 12/20/2014 Elsevier Interactive Patient Education 2016 Elsevier Inc.  Upper Respiratory Infection, Adult Most upper respiratory infections (URIs) are a viral infection of the air passages leading to the lungs. A URI affects the nose, throat,  and upper air passages. The most common type of URI is nasopharyngitis and is typically referred to as "the common cold." URIs run their course and usually go away on their own. Most of the time, a URI does not require medical attention, but sometimes a bacterial infection in the upper airways can follow a viral infection. This is called a secondary infection. Sinus and middle ear infections are common types of secondary upper respiratory  infections. Bacterial pneumonia can also complicate a URI. A URI can worsen asthma and chronic obstructive pulmonary disease (COPD). Sometimes, these complications can require emergency medical care and may be life threatening.  CAUSES Almost all URIs are caused by viruses. A virus is a type of germ and can spread from one person to another.  RISKS FACTORS You may be at risk for a URI if:   You smoke.   You have chronic heart or lung disease.  You have a weakened defense (immune) system.   You are very young or very old.   You have nasal allergies or asthma.  You work in crowded or poorly ventilated areas.  You work in health care facilities or schools. SIGNS AND SYMPTOMS  Symptoms typically develop 2-3 days after you come in contact with a cold virus. Most viral URIs last 7-10 days. However, viral URIs from the influenza virus (flu virus) can last 14-18 days and are typically more severe. Symptoms may include:   Runny or stuffy (congested) nose.   Sneezing.   Cough.   Sore throat.   Headache.   Fatigue.   Fever.   Loss of appetite.   Pain in your forehead, behind your eyes, and over your cheekbones (sinus pain).  Muscle aches.  DIAGNOSIS  Your health care provider may diagnose a URI by:  Physical exam.  Tests to check that your symptoms are not due to another condition such as:  Strep throat.  Sinusitis.  Pneumonia.  Asthma. TREATMENT  A URI goes away on its own with time. It cannot be cured with medicines, but medicines may be prescribed or recommended to relieve symptoms. Medicines may help:  Reduce your fever.  Reduce your cough.  Relieve nasal congestion. HOME CARE INSTRUCTIONS   Take medicines only as directed by your health care provider.   Gargle warm saltwater or take cough drops to comfort your throat as directed by your health care provider.  Use a warm mist humidifier or inhale steam from a shower to increase air moisture.  This may make it easier to breathe.  Drink enough fluid to keep your urine clear or pale yellow.   Eat soups and other clear broths and maintain good nutrition.   Rest as needed.   Return to work when your temperature has returned to normal or as your health care provider advises. You may need to stay home longer to avoid infecting others. You can also use a face mask and careful hand washing to prevent spread of the virus.  Increase the usage of your inhaler if you have asthma.   Do not use any tobacco products, including cigarettes, chewing tobacco, or electronic cigarettes. If you need help quitting, ask your health care provider. PREVENTION  The best way to protect yourself from getting a cold is to practice good hygiene.   Avoid oral or hand contact with people with cold symptoms.   Wash your hands often if contact occurs.  There is no clear evidence that vitamin C, vitamin E, echinacea, or exercise reduces the  chance of developing a cold. However, it is always recommended to get plenty of rest, exercise, and practice good nutrition.  SEEK MEDICAL CARE IF:   You are getting worse rather than better.   Your symptoms are not controlled by medicine.   You have chills.  You have worsening shortness of breath.  You have brown or red mucus.  You have yellow or brown nasal discharge.  You have pain in your face, especially when you bend forward.  You have a fever.  You have swollen neck glands.  You have pain while swallowing.  You have white areas in the back of your throat. SEEK IMMEDIATE MEDICAL CARE IF:   You have severe or persistent:  Headache.  Ear pain.  Sinus pain.  Chest pain.  You have chronic lung disease and any of the following:  Wheezing.  Prolonged cough.  Coughing up blood.  A change in your usual mucus.  You have a stiff neck.  You have changes in your:  Vision.  Hearing.  Thinking.  Mood. MAKE SURE YOU:    Understand these instructions.  Will watch your condition.  Will get help right away if you are not doing well or get worse.   This information is not intended to replace advice given to you by your health care provider. Make sure you discuss any questions you have with your health care provider.   Document Released: 04/08/2001 Document Revised: 02/27/2015 Document Reviewed: 01/18/2014 Elsevier Interactive Patient Education 2016 [employer]    IF you received an x-ray today, you will receive an invoice from [facility]. Please contact [facility] at [phone] with questions or concerns regarding your invoice.   IF you received labwork today, you will receive an invoice from [facility]. Please contact [facility] at [phone] with questions or concerns regarding your invoice.   Our billing staff will not be able to assist you with questions regarding bills from these companies.  You will be contacted with the lab results as soon as they are available. The fastest way to get your results is to activate your My Chart account. Instructions are located on the last page of this paperwork. If you have not heard from us regarding the results in 2 weeks, please contact this office.

## 2016-09-18 MED ORDER — ATORVASTATIN CALCIUM 10 MG PO TABS: 10.0000 mg | ORAL_TABLET | Freq: Every day | ORAL | 1 refills | Status: DC

## 2016-09-18 NOTE — Addendum Note (Signed)
Addended by: Neva SeatGREENE, Nicodemus Denk R on: 09/18/2016 11:00 AM   Modules accepted: Orders

## 2016-09-24 ENCOUNTER — Telehealth: Payer: Self-pay | Admitting: Emergency Medicine

## 2016-10-01 ENCOUNTER — Other Ambulatory Visit (HOSPITAL_COMMUNITY): Payer: BLUE CROSS/BLUE SHIELD

## 2016-10-01 ENCOUNTER — Encounter: Payer: Self-pay | Admitting: Cardiovascular Disease

## 2016-10-01 NOTE — Progress Notes (Deleted)
Patient ID: Lonnie PettyGauravkumar Richardson, male   DOB: 08/15/1971, 45 y.o.   MRN: [id number]   45 y.o. referred by Dr Lonnie Richardson January 2016  for abnormal ECG and family history of CAD  Longstanding RX HTN on calcium blocker.  DM  started on glucophage by primary 12/15  A1c 7.3  LDL 114  He tells me he is trying to change diet and has not started glucophage.  No chest pain dyspnea palpitations or syncope.  [place] [address]  Business is good.  One 43 yo daughter at [place].  Denies excesss ETOH or smoking Does take BP pill  Rx last 4 years.  Has not had recent stress test  He cannot specify what kind of heart problem his father had  Suggested coronary calcium score last visit but patient did not want to have out of pocket cost.   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  All other systems reviewed and negative   General: Affect appropriate Healthy:  appears stated age HEENT: normal Neck supple with no adenopathy JVP normal no bruits no thyromegaly Lungs clear with no wheezing and good diaphragmatic motion Heart:  S1/S2 no murmur,rub, gallop or click PMI normal Abdomen: benighn, BS positve, no tenderness, no AAA no bruit.  No HSM or HJR Distal pulses intact with no bruits No edema Neuro non-focal Skin warm and dry No muscular weakness  Medications Current Outpatient Prescriptions  Medication Sig Dispense Refill  . amLODipine (NORVASC) 5 MG tablet Take 1 tablet (5 mg total) by mouth daily. 90 tablet 1  . atorvastatin (LIPITOR) 10 MG tablet Take 1 tablet (10 mg total) by mouth daily. 90  tablet 1  . metFORMIN (GLUCOPHAGE) 500 MG tablet Take 1 tablet (500 mg total) by mouth daily with breakfast. (Patient not taking: Reported on 09/04/2016) 90 tablet 0   No current facility-administered medications for this visit.     Allergies Patient has no known allergies.  Family History: Family History  Problem Relation Age of Onset  . Diabetes Mother   . Hypertension Father     Social History: Social History   Social History  . Marital status: Married    Spouse name: N/A  . Number of children: N/A  . Years of education: N/A   Occupational History  . Not on file.   Social History Main Topics  . Smoking status: Never Smoker  . Smokeless tobacco: Never Used  . Alcohol use Yes  . Drug use: No  . Sexual activity: Yes   Other Topics Concern  . Not on file   Social History Narrative  . No narrative on file    Past Surgical History:  Procedure Laterality Date  . none      Past Medical History:  Diagnosis Date  . Diabetes mellitus without complication (HCC)   . Hypertension     Electrocardiogram:  12/15  SR rate 67 LAE nonspecific ST changes 11/13/15 SR rate 72 nonspecific ST/T Wave changes  Assessment and Plan  HTN: compliance with ACE stressed resume   Discussed importance for renal protection given DM And also benefits to EF  F/U echo  in 6 month if compliant  DM: Discussed low carb diet.  Target hemoglobin A1c is 6.5 or less.  Continue current medications. Family history of CAD  Encouraged calcium score no need for stress testing  Chol:  On statin now repeat labs 6 months with primary  Lab Results  Component Value Date   LDLCALC 146 (H) 09/04/2016     Lonnie Richardson

## 2016-10-06 ENCOUNTER — Ambulatory Visit: Payer: BLUE CROSS/BLUE SHIELD | Admitting: Cardiovascular Disease

## 2016-12-12 NOTE — Progress Notes (Deleted)
Patient ID: Lonnie Richardson, male   DOB: Jan 20, 1971, 46 y.o.   MRN: [id number]   46 y.o. referred by Lonnie Richardson January 2016  for abnormal ECG and family history of CAD  Longstanding RX HTN on calcium blocker.  DM  started on glucophage by primary 12/15  A1c 7.3  LDL 114  He tells me he is trying to change diet and has not started glucophage.  No chest pain dyspnea palpitations or syncope.  Sold the [employer] [address] is good.  One 92 yo daughter at [place].  Denies excesss ETOH or smoking Does take BP pill  Rx last 4 years.  Has not had recent stress test  He cannot specify what kind of heart problem his father had  Suggested coronary calcium score last visit but patient did not want to have out of pocket cost.   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  All other systems reviewed and negative   General: Affect appropriate Healthy:  appears stated age HEENT: normal Neck supple with no adenopathy JVP normal no bruits no thyromegaly Lungs clear with no wheezing and good diaphragmatic motion Heart:  S1/S2 no murmur,rub, gallop or click PMI normal Abdomen: benighn, BS positve, no tenderness, no AAA no bruit.  No HSM or HJR Distal pulses intact with no bruits No edema Neuro non-focal Skin warm and dry No muscular weakness  Medications Current Outpatient Prescriptions  Medication Sig Dispense Refill  . amLODipine (NORVASC) 5 MG tablet Take 1 tablet (5 mg total) by mouth daily. 90 tablet 1  . atorvastatin (LIPITOR) 10 MG tablet Take 1 tablet (10 mg total) by mouth daily. 90 tablet 1  .  metFORMIN (GLUCOPHAGE) 500 MG tablet Take 1 tablet (500 mg total) by mouth daily with breakfast. (Patient not taking: Reported on 09/04/2016) 90 tablet 0   No current facility-administered medications for this visit.     Allergies Patient has no known allergies.  Family History: Family History  Problem Relation Age of Onset  . Diabetes Mother   . Hypertension Father     Social History: Social History   Social History  . Marital status: Married    Spouse name: N/A  . Number of children: N/A  . Years of education: N/A   Occupational History  . Not on file.   Social History Main Topics  . Smoking status: Never Smoker  . Smokeless tobacco: Never Used  . Alcohol use Yes  . Drug use: No  . Sexual activity: Yes   Other Topics Concern  . Not on file   Social History Narrative  . No narrative on file    Past Surgical History:  Procedure Laterality Date  . none      Past Medical History:  Diagnosis Date  . Diabetes mellitus without complication (HCC)   . Hypertension     Electrocardiogram:  11/13/15   SR rate 67 LAE nonspecific ST changes  Assessment and Plan  HTN: compliance with ACE stressed resume   Discussed importance for renal protection given DM And also benefits to EF  F/U echo   DM: Discussed low carb diet.  Target hemoglobin A1c  is 6.5 or less.  Continue current medications. Family history of CAD  Encouraged calcium score no need for stress testing    Lonnie Richardson

## 2016-12-23 ENCOUNTER — Ambulatory Visit: Payer: BLUE CROSS/BLUE SHIELD | Admitting: Cardiovascular Disease

## 2016-12-24 ENCOUNTER — Encounter: Payer: Self-pay | Admitting: Cardiovascular Disease

## 2018-05-23 ENCOUNTER — Encounter (HOSPITAL_BASED_OUTPATIENT_CLINIC_OR_DEPARTMENT_OTHER): Payer: Self-pay | Admitting: Emergency Medicine

## 2018-05-23 ENCOUNTER — Other Ambulatory Visit: Payer: Self-pay

## 2018-05-23 ENCOUNTER — Emergency Department (HOSPITAL_BASED_OUTPATIENT_CLINIC_OR_DEPARTMENT_OTHER): Payer: No Typology Code available for payment source

## 2018-05-23 ENCOUNTER — Emergency Department (HOSPITAL_BASED_OUTPATIENT_CLINIC_OR_DEPARTMENT_OTHER)
Admission: EM | Admit: 2018-05-23 | Discharge: 2018-05-23 | Disposition: A | Discharge status: Home / Self Care | Payer: No Typology Code available for payment source | Attending: Emergency Medicine | Admitting: Emergency Medicine

## 2018-05-23 DIAGNOSIS — Y998 Other external cause status: Secondary | ICD-10-CM | POA: Diagnosis not present

## 2018-05-23 DIAGNOSIS — Z79899 Other long term (current) drug therapy: Secondary | ICD-10-CM | POA: Insufficient documentation

## 2018-05-23 DIAGNOSIS — I10 Essential (primary) hypertension: Secondary | ICD-10-CM | POA: Insufficient documentation

## 2018-05-23 DIAGNOSIS — Y92410 Unspecified street and highway as the place of occurrence of the external cause: Secondary | ICD-10-CM | POA: Insufficient documentation

## 2018-05-23 DIAGNOSIS — S2242XA Multiple fractures of ribs, left side, initial encounter for closed fracture: Secondary | ICD-10-CM | POA: Insufficient documentation

## 2018-05-23 DIAGNOSIS — R0602 Shortness of breath: Secondary | ICD-10-CM | POA: Diagnosis not present

## 2018-05-23 DIAGNOSIS — Y939 Activity, unspecified: Secondary | ICD-10-CM | POA: Diagnosis not present

## 2018-05-23 DIAGNOSIS — E119 Type 2 diabetes mellitus without complications: Secondary | ICD-10-CM | POA: Insufficient documentation

## 2018-05-23 DIAGNOSIS — S299XXA Unspecified injury of thorax, initial encounter: Secondary | ICD-10-CM | POA: Diagnosis present

## 2018-05-23 LAB — CBC WITH DIFFERENTIAL/PLATELET
BASOS ABS: 0 10*3/uL (ref 0.0–0.1)
BASOS PCT: 0 %
EOS ABS: 0.1 10*3/uL (ref 0.0–0.7)
EOS PCT: 1 %
HCT: 45.8 % (ref 39.0–52.0)
HEMOGLOBIN: 15.4 g/dL (ref 13.0–17.0)
Lymphocytes Relative: 7 %
Lymphs Abs: 1 10*3/uL (ref 0.7–4.0)
MCH: 29.9 pg (ref 26.0–34.0)
MCHC: 33.6 g/dL (ref 30.0–36.0)
MCV: 88.9 fL (ref 78.0–100.0)
Monocytes Absolute: 0.9 10*3/uL (ref 0.1–1.0)
Monocytes Relative: 6 %
NEUTROS PCT: 86 %
Neutro Abs: 12.6 10*3/uL — ABNORMAL HIGH (ref 1.7–7.7)
PLATELETS: 294 10*3/uL (ref 150–400)
RBC: 5.15 MIL/uL (ref 4.22–5.81)
RDW: 13.4 % (ref 11.5–15.5)
WBC: 14.7 10*3/uL — AB (ref 4.0–10.5)

## 2018-05-23 LAB — BASIC METABOLIC PANEL
Anion gap: 9 (ref 5–15)
BUN: 8 mg/dL (ref 6–20)
CALCIUM: 9.3 mg/dL (ref 8.9–10.3)
CHLORIDE: 102 mmol/L (ref 98–111)
CO2: 28 mmol/L (ref 22–32)
CREATININE: 0.96 mg/dL (ref 0.61–1.24)
Glucose, Bld: 120 mg/dL — ABNORMAL HIGH (ref 70–99)
Potassium: 3.7 mmol/L (ref 3.5–5.1)
SODIUM: 139 mmol/L (ref 135–145)

## 2018-05-23 LAB — HEPATIC FUNCTION PANEL
ALT: 26 U/L (ref 0–44)
AST: 23 U/L (ref 15–41)
Albumin: 4.3 g/dL (ref 3.5–5.0)
Alkaline Phosphatase: 53 U/L (ref 38–126)
BILIRUBIN DIRECT: 0.1 mg/dL (ref 0.0–0.2)
Indirect Bilirubin: 0.6 mg/dL (ref 0.3–0.9)
TOTAL PROTEIN: 7.6 g/dL (ref 6.5–8.1)
Total Bilirubin: 0.7 mg/dL (ref 0.3–1.2)

## 2018-05-23 LAB — LIPASE, BLOOD: LIPASE: 27 U/L (ref 11–51)

## 2018-05-23 MED ORDER — AMLODIPINE BESYLATE 5 MG PO TABS
5.0000 mg | ORAL_TABLET | Freq: Once | ORAL | Status: AC
  Administered 2018-05-23: 5 mg via ORAL
  Filled 2018-05-23: qty 1

## 2018-05-23 MED ORDER — NAPROXEN 500 MG PO TABS: 500.0000 mg | ORAL_TABLET | Freq: Two times a day (BID) | ORAL | 0 refills | Status: AC

## 2018-05-23 MED ORDER — IOPAMIDOL (ISOVUE-300) INJECTION 61%
100.0000 mL | Freq: Once | INTRAVENOUS | Status: AC | PRN
  Administered 2018-05-23: 100 mL via INTRAVENOUS

## 2018-05-23 MED ORDER — ACETAMINOPHEN 325 MG PO TABS
650.0000 mg | ORAL_TABLET | Freq: Once | ORAL | Status: AC
  Administered 2018-05-23: 650 mg via ORAL
  Filled 2018-05-23: qty 2

## 2018-05-23 MED ORDER — METHOCARBAMOL 500 MG PO TABS: 500.0000 mg | ORAL_TABLET | ORAL | 0 refills | Status: AC | PRN

## 2018-05-23 NOTE — ED Notes (Signed)
Patient in CT

## 2018-05-23 NOTE — ED Notes (Signed)
CT will need to wait for bun/creat/egfr to result prior to imaging with IV contrast, per [facility] radiology protocol, pt hx DM

## 2018-05-23 NOTE — ED Notes (Signed)
Pt ambulated to radiology and back without issue

## 2018-05-23 NOTE — ED Notes (Signed)
Pt verbalized understanding of discharge instructions.
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
## 2018-05-23 NOTE — Discharge Instructions (Addendum)
I have prescribed pain medication and muscle relaxer, please take as needed for pain.Do not drink or drive while taking this medication as it can make you drowsy:  You have a fever. You have difficulty breathing or shortness of breath. You develop a continual cough, or you cough up thick or bloody sputum. You feel sick to your stomach (nausea), throw up (vomit), or have abdominal pain. You have worsening pain not controlled with medications.

## 2018-05-23 NOTE — ED Notes (Signed)
Patient transported to X-ray 

## 2018-06-07 ENCOUNTER — Ambulatory Visit: Payer: Self-pay | Admitting: Family Medicine

## 2018-06-07 ENCOUNTER — Ambulatory Visit (INDEPENDENT_AMBULATORY_CARE_PROVIDER_SITE_OTHER): Payer: Self-pay

## 2018-06-07 ENCOUNTER — Encounter: Payer: Self-pay | Admitting: Family Medicine

## 2018-06-07 ENCOUNTER — Other Ambulatory Visit: Payer: Self-pay

## 2018-06-07 VITALS — BP 130/78 | HR 67 | Temp 98.0°F | Ht 68.0 in | Wt 168.0 lb

## 2018-06-07 DIAGNOSIS — I10 Essential (primary) hypertension: Secondary | ICD-10-CM

## 2018-06-07 DIAGNOSIS — S2242XA Multiple fractures of ribs, left side, initial encounter for closed fracture: Secondary | ICD-10-CM

## 2018-06-07 DIAGNOSIS — M25512 Pain in left shoulder: Secondary | ICD-10-CM

## 2018-06-07 MED ORDER — LISINOPRIL 5 MG PO TABS: 5.0000 mg | ORAL_TABLET | Freq: Every day | ORAL | 1 refills | Status: DC
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
## 2018-06-07 NOTE — Patient Instructions (Addendum)
Please see me in next 2 weeks for fasting labs and to discuss diabetes and blood pressure.  Can change to lisinopril once per day, and recheck levels at next visit.   Continue Tylenol over-the-counter for rib pain.  See information below on rib fractures.  Will check an x-ray of your shoulder, but I am encouraged by your exam today.  We can repeat the exam in 2 weeks and if still having difficulty could refer you to orthopedics.  Return to the clinic or go to the nearest emergency room if any of your symptoms worsen or new symptoms occur.   Rib Fracture A rib fracture is a break or crack in one of the bones of the ribs. The ribs are a group of long, curved bones that wrap around your chest and attach to your spine. They protect your lungs and other organs in the chest cavity. A broken or cracked rib is often painful, but most do not cause other problems. Most rib fractures heal on their own over time. However, rib fractures can be more serious if multiple ribs are broken or if broken ribs move out of place and push against other structures. What are the causes?  A direct blow to the chest. For example, this could happen during contact sports, a car accident, or a fall against a hard object.  Repetitive movements with high force, such as pitching a baseball or having severe coughing spells. What are the signs or symptoms?  Pain when you breathe in or cough.  Pain when someone presses on the injured area. How is this diagnosed? Your caregiver will perform a physical exam. Various imaging tests may be ordered to confirm the diagnosis and to look for related injuries. These tests may include a chest X-ray, computed tomography (CT), magnetic resonance imaging (MRI), or a bone scan. How is this treated? Rib fractures usually heal on their own in 1-3 months. The longer healing period is often associated with a continued cough or other aggravating activities. During the healing period, pain control  is very important. Medication is usually given to control pain. Hospitalization or surgery may be needed for more severe injuries, such as those in which multiple ribs are broken or the ribs have moved out of place. Follow these instructions at home:  Avoid strenuous activity and any activities or movements that cause pain. Be careful during activities and avoid bumping the injured rib.  Gradually increase activity as directed by your caregiver.  Only take over-the-counter or prescription medications as directed by your caregiver. Do not take other medications without asking your caregiver first.  Apply ice to the injured area for the first 1-2 days after you have been treated or as directed by your caregiver. Applying ice helps to reduce inflammation and pain. ? Put ice in a plastic bag. ? Place a towel between your skin and the bag. ? Leave the ice on for 15-20 minutes at a time, every 2 hours while you are awake.  Perform deep breathing as directed by your caregiver. This will help prevent pneumonia, which is a common complication of a broken rib. Your caregiver may instruct you to: ? Take deep breaths several times a day. ? Try to cough several times a day, holding a pillow against the injured area. ? Use a device called an incentive spirometer to practice deep breathing several times a day.  Drink enough fluids to keep your urine clear or pale yellow. This will help you avoid constipation.  Do  not wear a rib belt or binder. These restrict breathing, which can lead to pneumonia. Get help right away if:  You have a fever.  You have difficulty breathing or shortness of breath.  You develop a continual cough, or you cough up thick or bloody sputum.  You feel sick to your stomach (nausea), throw up (vomit), or have abdominal pain.  You have worsening pain not controlled with medications. This information is not intended to replace advice given to you by your health care provider.  Make sure you discuss any questions you have with your health care provider. Document Released: 10/13/2005 Document Revised: 03/20/2016 Document Reviewed: 12/15/2012 Elsevier Interactive Patient Education  2018 [employer]   IF you received an x-ray today, you will receive an invoice from [facility]. Please contact [facility] at [phone] with questions or concerns regarding your invoice.   IF you received labwork today, you will receive an invoice from [facility]. Please contact [facility] at [phone] with questions or concerns regarding your invoice.   Our billing staff will not be able to assist you with questions regarding bills from these companies.  You will be contacted with the lab results as soon as they are available. The fastest way to get your results is to activate your My Chart account. Instructions are located on the last page of this paperwork. If you have not heard from us regarding the results in 2 weeks, please contact this office.

## 2018-06-15 ENCOUNTER — Encounter: Payer: Self-pay | Admitting: Family Medicine

## 2018-06-15 ENCOUNTER — Ambulatory Visit: Payer: Self-pay | Admitting: Family Medicine

## 2018-06-15 ENCOUNTER — Other Ambulatory Visit: Payer: Self-pay

## 2018-06-15 VITALS — BP 118/81 | HR 58 | Temp 98.0°F | Ht 68.0 in | Wt 164.4 lb

## 2018-06-15 DIAGNOSIS — S2242XD Multiple fractures of ribs, left side, subsequent encounter for fracture with routine healing: Secondary | ICD-10-CM

## 2018-06-15 DIAGNOSIS — M25512 Pain in left shoulder: Secondary | ICD-10-CM

## 2018-06-15 DIAGNOSIS — I10 Essential (primary) hypertension: Secondary | ICD-10-CM

## 2018-06-15 DIAGNOSIS — M87051 Idiopathic aseptic necrosis of right femur: Secondary | ICD-10-CM

## 2018-06-15 DIAGNOSIS — K4020 Bilateral inguinal hernia, without obstruction or gangrene, not specified as recurrent: Secondary | ICD-10-CM

## 2018-06-15 DIAGNOSIS — E119 Type 2 diabetes mellitus without complications: Secondary | ICD-10-CM

## 2018-06-15 LAB — LIPID PANEL
CHOLESTEROL TOTAL: 192 mg/dL (ref 100–199)
Chol/HDL Ratio: 4.3 ratio (ref 0.0–5.0)
HDL: 45 mg/dL (ref >39)
LDL Calculated: 124 mg/dL — ABNORMAL HIGH (ref 0–99)
Triglycerides: 114 mg/dL (ref 0–149)
VLDL CHOLESTEROL CAL: 23 mg/dL (ref 5–40)

## 2018-06-15 LAB — HEMOGLOBIN A1C
ESTIMATED AVERAGE GLUCOSE: 154 mg/dL
HEMOGLOBIN A1C: 7 % — AB (ref 4.8–5.6)
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
## 2018-06-15 NOTE — Patient Instructions (Addendum)
Ok to try lisinopril instead of amlodipine. If new cough, return so we can discuss other options.   I will check diabetes test today, and cholesterol.  We can check electrolytes at next visit in 1 month after starting the lisinopril.   Rib pain should continue to improve. Go slow with lifting/physical activity.   I will refer you to ortho to discuss the left shoulder symptoms as well as review the CT scan results of your right hip for possible avascular necrosis.   I will refer you to general surgery to discuss hernia, but as those are not symptomatic I do not think any procedure needed at this time. See precautions below.   Hernia, Adult A hernia is the bulging of an organ or tissue through a weak spot in the muscles of the abdomen (abdominal wall). Hernias develop most often near the navel or groin. There are many kinds of hernias. Common kinds include:  Femoral hernia. This kind of hernia develops under the groin in the upper thigh area.  Inguinal hernia. This kind of hernia develops in the groin or scrotum.  Umbilical hernia. This kind of hernia develops near the navel.  Hiatal hernia. This kind of hernia causes part of the stomach to be pushed up into the chest.  Incisional hernia. This kind of hernia bulges through a scar from an abdominal surgery.  What are the causes? This condition may be caused by:  Heavy lifting.  Coughing over a long period of time.  Straining to have a bowel movement.  An incision made during an abdominal surgery.  A birth defect (congenital defect).  Excess weight or obesity.  Smoking.  Poor nutrition.  Cystic fibrosis.  Excess fluid in the abdomen.  Undescended testicles.  What are the signs or symptoms? Symptoms of a hernia include:  A lump on the abdomen. This is the first sign of a hernia. The lump may become more obvious with standing, straining, or coughing. It may get bigger over time if it is not treated or if the condition  causing it is not treated.  Pain. A hernia is usually painless, but it may become painful over time if treatment is delayed. The pain is usually dull and may get worse with standing or lifting heavy objects.  Sometimes a hernia gets tightly squeezed in the weak spot (strangulated) or stuck there (incarcerated) and causes additional symptoms. These symptoms may include:  Vomiting.  Nausea.  Constipation.  Irritability.  How is this diagnosed? A hernia may be diagnosed with:  A physical exam. During the exam your health care provider may ask you to cough or to make a specific movement, because a hernia is usually more visible when you move.  Imaging tests. These can include: ? X-rays. ? Ultrasound. ? CT scan.  How is this treated? A hernia that is small and painless may not need to be treated. A hernia that is large or painful may be treated with surgery. Inguinal hernias may be treated with surgery to prevent incarceration or strangulation. Strangulated hernias are always treated with surgery, because lack of blood to the trapped organ or tissue can cause it to die. Surgery to treat a hernia involves pushing the bulge back into place and repairing the weak part of the abdomen. Follow these instructions at home:  Avoid straining.  Do not lift anything heavier than 10 lb (4.5 kg).  Lift with your leg muscles, not your back muscles. This helps avoid strain.  When coughing, try to  cough gently.  Prevent constipation. Constipation leads to straining with bowel movements, which can make a hernia worse or cause a hernia repair to break down. You can prevent constipation by: ? Eating a high-fiber diet that includes plenty of fruits and vegetables. ? Drinking enough fluids to keep your urine clear or pale yellow. Aim to drink 6-8 glasses of water per day. ? Using a stool softener as directed by your health care provider.  Lose weight, if you are overweight.  Do not use any tobacco  products, including cigarettes, chewing tobacco, or electronic cigarettes. If you need help quitting, ask your health care provider.  Keep all follow-up visits as directed by your health care provider. This is important. Your health care provider may need to monitor your condition. Contact a health care provider if:  You have swelling, redness, and pain in the affected area.  Your bowel habits change. Get help right away if:  You have a fever.  You have abdominal pain that is getting worse.  You feel nauseous or you vomit.  You cannot push the hernia back in place by gently pressing on it while you are lying down.  The hernia: ? Changes in shape or size. ? Is stuck outside the abdomen. ? Becomes discolored. ? Feels hard or tender. This information is not intended to replace advice given to you by your health care provider. Make sure you discuss any questions you have with your health care provider. Document Released: 10/13/2005 Document Revised: 03/12/2016 Document Reviewed: 08/23/2014 Elsevier Interactive Patient Education  2017 [employer]   If you have lab work done today you will be contacted with your lab results within the next 2 weeks.  If you have not heard from us then please contact us. The fastest way to get your results is to register for My Chart.   IF you received an x-ray today, you will receive an invoice from [facility]. Please contact [facility] at [phone] with questions or concerns regarding your invoice.   IF you received labwork today, you will receive an invoice from [facility]. Please contact [facility] at [phone] with questions or concerns regarding your invoice.   Our billing staff will not be able to assist you with questions regarding bills from these companies.  You will be contacted with the lab results as soon as they are available. The fastest way to get your results is to activate your My Chart account. Instructions are  located on the last page of this paperwork. If you have not heard from us regarding the results in 2 weeks, please contact this office.

## 2018-06-29 ENCOUNTER — Ambulatory Visit (INDEPENDENT_AMBULATORY_CARE_PROVIDER_SITE_OTHER): Payer: Self-pay | Admitting: Orthopaedic Surgery

## 2018-06-30 ENCOUNTER — Encounter: Payer: Self-pay | Admitting: *Deleted

## 2018-07-17 ENCOUNTER — Encounter: Payer: Self-pay | Admitting: Family Medicine

## 2018-07-17 ENCOUNTER — Ambulatory Visit (INDEPENDENT_AMBULATORY_CARE_PROVIDER_SITE_OTHER): Payer: Self-pay | Admitting: Family Medicine

## 2018-07-17 ENCOUNTER — Other Ambulatory Visit: Payer: Self-pay

## 2018-07-17 VITALS — BP 126/83 | HR 60 | Temp 98.2°F | Resp 18 | Ht 69.13 in | Wt 173.4 lb

## 2018-07-17 DIAGNOSIS — I10 Essential (primary) hypertension: Secondary | ICD-10-CM

## 2018-07-17 DIAGNOSIS — Z23 Encounter for immunization: Secondary | ICD-10-CM

## 2018-07-17 DIAGNOSIS — E119 Type 2 diabetes mellitus without complications: Secondary | ICD-10-CM

## 2018-07-17 DIAGNOSIS — Z114 Encounter for screening for human immunodeficiency virus [HIV]: Secondary | ICD-10-CM

## 2018-07-17 MED ORDER — LISINOPRIL 5 MG PO TABS: 5.0000 mg | ORAL_TABLET | Freq: Every day | ORAL | 1 refills | Status: DC

## 2018-07-17 NOTE — Progress Notes (Signed)
Subjective:  By signing my name below, I, [name], attest that this documentation has been prepared under the direction and in the presence of [name], MD. Electronically Signed: [name], Scribe. 07/17/2018 , 8:55 AM .  Patient was seen in Room 11 .   Patient ID: Lonnie Richardson, male    DOB: Nov 14, 1970, 47 y.o.   MRN: [id number] Chief Complaint  Patient presents with  . Hypertension    f/u  b/p and labs   HPI Lonnie Richardson is a 47 y.o. male Here for follow up of HTN. Last seen on Aug 20th.   HTN Patient had tried lisinopril, but due to possible cough and other concerns, switched back to amlodipine prior to last visit. He had only tried 1 dose. Recommended to retry lisinopril, but if any new cough, consider ARB. Plan for labs today. There was a concern from his dentist in [place] of gum swelling with amlodipine.   BP Readings from Last 3 Encounters:  07/17/18 126/83  06/15/18 118/81  06/07/18 130/78   Lab Results  Component Value Date   CREATININE 0.96 05/23/2018   He's been taking lisinopril 5 mg qd, and it seems to be okay. He hasn't noticed any new coughs. He denies chest pain, shortness of breath, lightheadedness, dizziness or headaches.   Diabetes Lab Results  Component Value Date   HGBA1C 7.0 (H) 06/15/2018   Plan for diet and exercise; recheck A1C in 3-6 months. He denies checking his blood sugars at home. He plans to cut out bread. He denies blurry vision.   Optho: last seen in Nov 2018; no diabetic retinopathy Pneumovax: agrees to pneumovax today.   Health Maintenance HIV screening: checked on blood test today.  Flu shot: declines flu shot today.   Hyperlipidemia Lab Results  Component Value Date   CHOL 192 06/15/2018   HDL 45 06/15/2018   LDLCALC 124 (H) 06/15/2018   TRIG 114 06/15/2018   CHOLHDL 4.3 06/15/2018   Lab Results  Component Value Date   ALT 26 05/23/2018   AST 23 05/23/2018   ALKPHOS 53 05/23/2018   BILITOT 0.7  05/23/2018   LDL had decreased from 146 to 124; plan to continue diet and exercise.    Wt Readings from Last 3 Encounters:  07/17/18 173 lb 6.4 oz (78.7 kg)  06/15/18 164 lb 6.4 oz (74.6 kg)  06/07/18 168 lb (76.2 kg)    Patient Active Problem List   Diagnosis Date Noted  . Family history of early CAD 11/09/2014  . Elevated blood sugar 11/09/2014  . Abnormal ECG 11/09/2014  . HTN (hypertension) 01/14/2012   Past Medical History:  Diagnosis Date  . Diabetes mellitus without complication (HCC)   . Hypertension    Past Surgical History:  Procedure Laterality Date  . none     No Known Allergies Prior to Admission medications   Medication Sig Start Date End Date Taking? Authorizing Provider  lisinopril (PRINIVIL,ZESTRIL) 5 MG tablet Take 1 tablet (5 mg total) by mouth daily. 06/07/18  Yes Shade Flood, MD   Social History   Socioeconomic History  . Marital status: Married    Spouse name: Not on file  . Number of children: 1  . Years of education: Not on file  . Highest education level: Not on file  Occupational History  . Not on file  Social Needs  . Financial resource strain: Not on file  . Food insecurity:    Worry: Not on file  Inability: Not on file  . Transportation needs:    Medical: Not on file    Non-medical: Not on file  Tobacco Use  . Smoking status: Never Smoker  . Smokeless tobacco: Never Used  Substance and Sexual Activity  . Alcohol use: Yes  . Drug use: No  . Sexual activity: Yes  Lifestyle  . Physical activity:    Days per week: Not on file    Minutes per session: Not on file  . Stress: Not on file  Relationships  . Social connections:    Talks on phone: Not on file    Gets together: Not on file    Attends religious service: Not on file    Active member of club or organization: Not on file    Attends meetings of clubs or organizations: Not on file    Relationship status: Not on file  . Intimate partner violence:    Fear of  current or ex partner: Not on file    Emotionally abused: Not on file    Physically abused: Not on file    Forced sexual activity: Not on file  Other Topics Concern  . Not on file  Social History Narrative  . Not on file   Review of Systems  Constitutional: Negative for fatigue and unexpected weight change.  Eyes: Negative for visual disturbance.  Respiratory: Negative for cough, chest tightness and shortness of breath.   Cardiovascular: Negative for chest pain, palpitations and leg swelling.  Gastrointestinal: Negative for abdominal pain and blood in stool.  Neurological: Negative for dizziness, light-headedness and headaches.       Objective:   Physical Exam  Constitutional: He is oriented to person, place, and time. He appears well-developed and well-nourished.  HENT:  Head: Normocephalic and atraumatic.  Eyes: Pupils are equal, round, and reactive to light. EOM are normal.  Neck: No JVD present. Carotid bruit is not present.  Cardiovascular: Normal rate, regular rhythm and normal heart sounds.  No murmur heard. Pulmonary/Chest: Effort normal and breath sounds normal. He has no rales.  Musculoskeletal: He exhibits no edema.  Neurological: He is alert and oriented to person, place, and time.  Skin: Skin is warm and dry.  Psychiatric: He has a normal mood and affect.  Vitals reviewed.   Vitals:   07/17/18 0823  BP: 126/83  Pulse: 60  Resp: 18  Temp: 98.2 F (36.8 C)  TempSrc: Oral  SpO2: 98%  Weight: 173 lb 6.4 oz (78.7 kg)  Height: 5' 9.13" (1.756 m)       Assessment & Plan:    Lonnie Richardson is a 47 y.o. male Type 2 diabetes mellitus without complication, without long-term current use of insulin (HCC)  -Borderline control, but plan for continue diet approach.  Handout given on nutrition and diabetes.  Recheck A1c in the next 2 to 3 months  Essential hypertension - Plan: lisinopril (PRINIVIL,ZESTRIL) 5 MG tablet, Basic metabolic panel  -Stable,  tolerating ACE inhibitor.  Check BMP.  Screening for HIV without presence of risk factors - Plan: HIV Antibody (routine testing w rflx)  Need for prophylactic vaccination against Streptococcus pneumoniae (pneumococcus) - Plan: Pneumococcal polysaccharide vaccine 23-valent greater than or equal to 2yo subcutaneous/IM  Also recommended flu vaccine, declined at this time. Meds ordered this encounter  Medications  . lisinopril (PRINIVIL,ZESTRIL) 5 MG tablet    Sig: Take 1 tablet (5 mg total) by mouth daily.    Dispense:  90 tablet    Refill:  1  Patient Instructions    No change in meds for now. See info on nutrition with diabetes below.   Recheck in 3 months for repeat sugar test.   Pneumonia vaccine given today.  I will check some blood work.  I do recommend flu shot, let me know if you change your mind.  Thank you for coming in today.   Diabetes Mellitus and Nutrition When you have diabetes (diabetes mellitus), it is very important to have healthy eating habits because your blood sugar (glucose) levels are greatly affected by what you eat and drink. Eating healthy foods in the appropriate amounts, at about the same times every day, can help you:  Control your blood glucose.  Lower your risk of heart disease.  Improve your blood pressure.  Reach or maintain a healthy weight.  Every person with diabetes is different, and each person has different needs for a meal plan. Your health care provider may recommend that you work with a diet and nutrition specialist (dietitian) to make a meal plan that is best for you. Your meal plan may vary depending on factors such as:  The calories you need.  The medicines you take.  Your weight.  Your blood glucose, blood pressure, and cholesterol levels.  Your activity level.  Other health conditions you have, such as heart or kidney disease.  How do carbohydrates affect me? Carbohydrates affect your blood glucose level more than any  other type of food. Eating carbohydrates naturally increases the amount of glucose in your blood. Carbohydrate counting is a method for keeping track of how many carbohydrates you eat. Counting carbohydrates is important to keep your blood glucose at a healthy level, especially if you use insulin or take certain oral diabetes medicines. It is important to know how many carbohydrates you can safely have in each meal. This is different for every person. Your dietitian can help you calculate how many carbohydrates you should have at each meal and for snack. Foods that contain carbohydrates include:  Bread, cereal, rice, pasta, and crackers.  Potatoes and corn.  Peas, beans, and lentils.  Milk and yogurt.  Fruit and juice.  Desserts, such as cakes, cookies, ice cream, and candy.  How does alcohol affect me? Alcohol can cause a sudden decrease in blood glucose (hypoglycemia), especially if you use insulin or take certain oral diabetes medicines. Hypoglycemia can be a life-threatening condition. Symptoms of hypoglycemia (sleepiness, dizziness, and confusion) are similar to symptoms of having too much alcohol. If your health care provider says that alcohol is safe for you, follow these guidelines:  Limit alcohol intake to no more than 1 drink per day for nonpregnant women and 2 drinks per day for men. One drink equals 12 oz of beer, 5 oz of wine, or 1 oz of hard liquor.  Do not drink on an empty stomach.  Keep yourself hydrated with water, diet soda, or unsweetened iced tea.  Keep in mind that regular soda, juice, and other mixers may contain a lot of sugar and must be counted as carbohydrates.  What are tips for following this plan? Reading food labels  Start by checking the serving size on the label. The amount of calories, carbohydrates, fats, and other nutrients listed on the label are based on one serving of the food. Many foods contain more than one serving per package.  Check the  total grams (g) of carbohydrates in one serving. You can calculate the number of servings of carbohydrates in one serving by dividing  the total carbohydrates by 15. For example, if a food has 30 g of total carbohydrates, it would be equal to 2 servings of carbohydrates.  Check the number of grams (g) of saturated and trans fats in one serving. Choose foods that have low or no amount of these fats.  Check the number of milligrams (mg) of sodium in one serving. Most people should limit total sodium intake to less than 2,300 mg per day.  Always check the nutrition information of foods labeled as "low-fat" or "nonfat". These foods may be higher in added sugar or refined carbohydrates and should be avoided.  Talk to your dietitian to identify your daily goals for nutrients listed on the label. Shopping  Avoid buying canned, premade, or processed foods. These foods tend to be high in fat, sodium, and added sugar.  Shop around the outside edge of the grocery store. This includes fresh fruits and vegetables, bulk grains, fresh meats, and fresh dairy. Cooking  Use low-heat cooking methods, such as baking, instead of high-heat cooking methods like deep frying.  Cook using healthy oils, such as olive, canola, or sunflower oil.  Avoid cooking with butter, cream, or high-fat meats. Meal planning  Eat meals and snacks regularly, preferably at the same times every day. Avoid going long periods of time without eating.  Eat foods high in fiber, such as fresh fruits, vegetables, beans, and whole grains. Talk to your dietitian about how many servings of carbohydrates you can eat at each meal.  Eat 4-6 ounces of lean protein each day, such as lean meat, chicken, fish, eggs, or tofu. 1 ounce is equal to 1 ounce of meat, chicken, or fish, 1 egg, or 1/4 cup of tofu.  Eat some foods each day that contain healthy fats, such as avocado, nuts, seeds, and fish. Lifestyle   Check your blood glucose  regularly.  Exercise at least 30 minutes 5 or more days each week, or as told by your health care provider.  Take medicines as told by your health care provider.  Do not use any products that contain nicotine or tobacco, such as cigarettes and e-cigarettes. If you need help quitting, ask your health care provider.  Work with a Veterinary surgeoncounselor or diabetes educator to identify strategies to manage stress and any emotional and social challenges. What are some questions to ask my health care provider?  Do I need to meet with a diabetes educator?  Do I need to meet with a dietitian?  What number can I call if I have questions?  When are the best times to check my blood glucose? Where to find more information:  American Diabetes Association: diabetes.org/food-and-fitness/food  Academy of Nutrition and Dietetics: https://www.vargas.com/www.eatright.org/resources/health/diseases-and-conditions/diabetes  [employer] Institute of Diabetes and Digestive and Kidney Diseases (NIH): FindJewelers.czwww.niddk.nih.gov/health-information/diabetes/overview/diet-eating-physical-activity Summary  A healthy meal plan will help you control your blood glucose and maintain a healthy lifestyle.  Working with a diet and nutrition specialist (dietitian) can help you make a meal plan that is best for you.  Keep in mind that carbohydrates and alcohol have immediate effects on your blood glucose levels. It is important to count carbohydrates and to use alcohol carefully. This information is not intended to replace advice given to you by your health care provider. Make sure you discuss any questions you have with your health care provider. Document Released: 07/10/2005 Document Revised: 11/17/2016 Document Reviewed: 11/17/2016 Elsevier Interactive Patient Education  [employer]2018 Elsevier Inc.       If you have lab work done today  you will be contacted with your lab results within the next 2 weeks.  If you have not heard from [place] then please contact us. The  fastest way to get your results is to register for My Chart.   IF you received an x-ray today, you will receive an invoice from [facility]. Please contact [facility] at [phone] with questions or concerns regarding your invoice.   IF you received labwork today, you will receive an invoice from [facility]. Please contact [facility] at [phone] with questions or concerns regarding your invoice.   Our billing staff will not be able to assist you with questions regarding bills from these companies.  You will be contacted with the lab results as soon as they are available. The fastest way to get your results is to activate your My Chart account. Instructions are located on the last page of this paperwork. If you have not heard from [place] regarding the results in 2 weeks, please contact this office.      I personally performed the services described in this documentation, which was scribed in my presence. The recorded information has been reviewed and considered for accuracy and completeness, addended by me as needed, and agree with information above.  Signed,   [name], MD [facility].  07/17/18 9:02 AM

## 2018-07-17 NOTE — Patient Instructions (Addendum)
No change in meds for now. See info on nutrition with diabetes below.   Recheck in 3 months for repeat sugar test.   Pneumonia vaccine given today.  I will check some blood work.  I do recommend flu shot, let me know if you change your mind.  Thank you for coming in today.   Diabetes Mellitus and Nutrition When you have diabetes (diabetes mellitus), it is very important to have healthy eating habits because your blood sugar (glucose) levels are greatly affected by what you eat and drink. Eating healthy foods in the appropriate amounts, at about the same times every day, can help you:  Control your blood glucose.  Lower your risk of heart disease.  Improve your blood pressure.  Reach or maintain a healthy weight.  Every person with diabetes is different, and each person has different needs for a meal plan. Your health care provider may recommend that you work with a diet and nutrition specialist (dietitian) to make a meal plan that is best for you. Your meal plan may vary depending on factors such as:  The calories you need.  The medicines you take.  Your weight.  Your blood glucose, blood pressure, and cholesterol levels.  Your activity level.  Other health conditions you have, such as heart or kidney disease.  How do carbohydrates affect me? Carbohydrates affect your blood glucose level more than any other type of food. Eating carbohydrates naturally increases the amount of glucose in your blood. Carbohydrate counting is a method for keeping track of how many carbohydrates you eat. Counting carbohydrates is important to keep your blood glucose at a healthy level, especially if you use insulin or take certain oral diabetes medicines. It is important to know how many carbohydrates you can safely have in each meal. This is different for every person. Your dietitian can help you calculate how many carbohydrates you should have at each meal and for snack. Foods that contain  carbohydrates include:  Bread, cereal, rice, pasta, and crackers.  Potatoes and corn.  Peas, beans, and lentils.  Milk and yogurt.  Fruit and juice.  Desserts, such as cakes, cookies, ice cream, and candy.  How does alcohol affect me? Alcohol can cause a sudden decrease in blood glucose (hypoglycemia), especially if you use insulin or take certain oral diabetes medicines. Hypoglycemia can be a life-threatening condition. Symptoms of hypoglycemia (sleepiness, dizziness, and confusion) are similar to symptoms of having too much alcohol. If your health care provider says that alcohol is safe for you, follow these guidelines:  Limit alcohol intake to no more than 1 drink per day for nonpregnant women and 2 drinks per day for men. One drink equals 12 oz of beer, 5 oz of wine, or 1 oz of hard liquor.  Do not drink on an empty stomach.  Keep yourself hydrated with water, diet soda, or unsweetened iced tea.  Keep in mind that regular soda, juice, and other mixers may contain a lot of sugar and must be counted as carbohydrates.  What are tips for following this plan? Reading food labels  Start by checking the serving size on the label. The amount of calories, carbohydrates, fats, and other nutrients listed on the label are based on one serving of the food. Many foods contain more than one serving per package.  Check the total grams (g) of carbohydrates in one serving. You can calculate the number of servings of carbohydrates in one serving by dividing the total carbohydrates by 15. For  example, if a food has 30 g of total carbohydrates, it would be equal to 2 servings of carbohydrates.  Check the number of grams (g) of saturated and trans fats in one serving. Choose foods that have low or no amount of these fats.  Check the number of milligrams (mg) of sodium in one serving. Most people should limit total sodium intake to less than 2,300 mg per day.  Always check the nutrition  information of foods labeled as "low-fat" or "nonfat". These foods may be higher in added sugar or refined carbohydrates and should be avoided.  Talk to your dietitian to identify your daily goals for nutrients listed on the label. Shopping  Avoid buying canned, premade, or processed foods. These foods tend to be high in fat, sodium, and added sugar.  Shop around the outside edge of the grocery store. This includes fresh fruits and vegetables, bulk grains, fresh meats, and fresh dairy. Cooking  Use low-heat cooking methods, such as baking, instead of high-heat cooking methods like deep frying.  Cook using healthy oils, such as olive, canola, or sunflower oil.  Avoid cooking with butter, cream, or high-fat meats. Meal planning  Eat meals and snacks regularly, preferably at the same times every day. Avoid going long periods of time without eating.  Eat foods high in fiber, such as fresh fruits, vegetables, beans, and whole grains. Talk to your dietitian about how many servings of carbohydrates you can eat at each meal.  Eat 4-6 ounces of lean protein each day, such as lean meat, chicken, fish, eggs, or tofu. 1 ounce is equal to 1 ounce of meat, chicken, or fish, 1 egg, or 1/4 cup of tofu.  Eat some foods each day that contain healthy fats, such as avocado, nuts, seeds, and fish. Lifestyle   Check your blood glucose regularly.  Exercise at least 30 minutes 5 or more days each week, or as told by your health care provider.  Take medicines as told by your health care provider.  Do not use any products that contain nicotine or tobacco, such as cigarettes and e-cigarettes. If you need help quitting, ask your health care provider.  Work with a Veterinary surgeoncounselor or diabetes educator to identify strategies to manage stress and any emotional and social challenges. What are some questions to ask my health care provider?  Do I need to meet with a diabetes educator?  Do I need to meet with a  dietitian?  What number can I call if I have questions?  When are the best times to check my blood glucose? Where to find more information:  American Diabetes Association: diabetes.org/food-and-fitness/food  Academy of Nutrition and Dietetics: https://www.vargas.com/www.eatright.org/resources/health/diseases-and-conditions/diabetes  [employer] Institute of Diabetes and Digestive and Kidney Diseases (NIH): FindJewelers.czwww.niddk.nih.gov/health-information/diabetes/overview/diet-eating-physical-activity Summary  A healthy meal plan will help you control your blood glucose and maintain a healthy lifestyle.  Working with a diet and nutrition specialist (dietitian) can help you make a meal plan that is best for you.  Keep in mind that carbohydrates and alcohol have immediate effects on your blood glucose levels. It is important to count carbohydrates and to use alcohol carefully. This information is not intended to replace advice given to you by your health care provider. Make sure you discuss any questions you have with your health care provider. Document Released: 07/10/2005 Document Revised: 11/17/2016 Document Reviewed: 11/17/2016 Elsevier Interactive Patient Education  [employer]2018 Elsevier Inc.       If you have lab work done today you will be contacted with  your lab results within the next 2 weeks.  If you have not heard from [place] then please contact us. The fastest way to get your results is to register for My Chart.   IF you received an x-ray today, you will receive an invoice from [facility]. Please contact [facility] at [phone] with questions or concerns regarding your invoice.   IF you received labwork today, you will receive an invoice from [facility]. Please contact [facility] at [phone] with questions or concerns regarding your invoice.   Our billing staff will not be able to assist you with questions regarding bills from these companies.  You will be contacted with the lab results as soon  as they are available. The fastest way to get your results is to activate your My Chart account. Instructions are located on the last page of this paperwork. If you have not heard from [place] regarding the results in 2 weeks, please contact this office.

## 2018-07-18 LAB — BASIC METABOLIC PANEL
BUN / CREAT RATIO: 11 (ref 9–20)
BUN: 10 mg/dL (ref 6–24)
CO2: 23 mmol/L (ref 20–29)
CREATININE: 0.94 mg/dL (ref 0.76–1.27)
Calcium: 9.6 mg/dL (ref 8.7–10.2)
Chloride: 101 mmol/L (ref 96–106)
GFR, EST AFRICAN AMERICAN: 113 mL/min/{1.73_m2} (ref >59)
GFR, EST NON AFRICAN AMERICAN: 98 mL/min/{1.73_m2} (ref >59)
Glucose: 125 mg/dL — ABNORMAL HIGH (ref 65–99)
Potassium: 5 mmol/L (ref 3.5–5.2)
Sodium: 139 mmol/L (ref 134–144)

## 2018-07-18 LAB — HIV ANTIBODY (ROUTINE TESTING W REFLEX): HIV Screen 4th Generation wRfx: NONREACTIVE

## 2018-07-19 ENCOUNTER — Ambulatory Visit (INDEPENDENT_AMBULATORY_CARE_PROVIDER_SITE_OTHER): Payer: Self-pay | Admitting: Orthopaedic Surgery

## 2018-07-19 ENCOUNTER — Encounter (INDEPENDENT_AMBULATORY_CARE_PROVIDER_SITE_OTHER): Payer: Self-pay | Admitting: Orthopaedic Surgery

## 2018-07-19 VITALS — BP 137/89 | HR 69 | Ht 68.0 in | Wt 170.0 lb

## 2018-07-19 DIAGNOSIS — M25512 Pain in left shoulder: Secondary | ICD-10-CM
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
## 2018-08-02 ENCOUNTER — Encounter: Payer: Self-pay | Admitting: *Deleted

## 2018-08-23 ENCOUNTER — Ambulatory Visit (INDEPENDENT_AMBULATORY_CARE_PROVIDER_SITE_OTHER): Payer: Self-pay | Admitting: Orthopaedic Surgery

## 2018-08-23 ENCOUNTER — Encounter (INDEPENDENT_AMBULATORY_CARE_PROVIDER_SITE_OTHER): Payer: Self-pay | Admitting: Orthopaedic Surgery

## 2018-08-23 DIAGNOSIS — G8929 Other chronic pain: Secondary | ICD-10-CM

## 2018-08-23 DIAGNOSIS — M25512 Pain in left shoulder: Secondary | ICD-10-CM
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
## 2018-09-13 ENCOUNTER — Ambulatory Visit (INDEPENDENT_AMBULATORY_CARE_PROVIDER_SITE_OTHER): Payer: Self-pay | Admitting: Orthopaedic Surgery

## 2018-10-05 ENCOUNTER — Ambulatory Visit: Payer: Self-pay | Admitting: Family Medicine

## 2018-10-05 ENCOUNTER — Other Ambulatory Visit: Payer: Self-pay

## 2018-10-05 ENCOUNTER — Encounter: Payer: Self-pay | Admitting: Family Medicine

## 2018-10-05 VITALS — BP 125/83 | HR 57 | Temp 99.2°F | Ht 68.0 in | Wt 176.8 lb

## 2018-10-05 DIAGNOSIS — E785 Hyperlipidemia, unspecified: Secondary | ICD-10-CM

## 2018-10-05 DIAGNOSIS — I10 Essential (primary) hypertension: Secondary | ICD-10-CM

## 2018-10-05 DIAGNOSIS — E119 Type 2 diabetes mellitus without complications: Secondary | ICD-10-CM

## 2018-10-05 DIAGNOSIS — H6691 Otitis media, unspecified, right ear: Secondary | ICD-10-CM

## 2018-10-05 DIAGNOSIS — J029 Acute pharyngitis, unspecified: Secondary | ICD-10-CM

## 2018-10-05 DIAGNOSIS — J069 Acute upper respiratory infection, unspecified: Secondary | ICD-10-CM

## 2018-10-05 LAB — POCT RAPID STREP A (OFFICE): RAPID STREP A SCREEN: NEGATIVE

## 2018-10-05 MED ORDER — LISINOPRIL 5 MG PO TABS: 5.0000 mg | ORAL_TABLET | Freq: Every day | ORAL | 1 refills | Status: DC

## 2018-10-05 MED ORDER — AMOXICILLIN 875 MG PO TABS: 875.0000 mg | ORAL_TABLET | Freq: Two times a day (BID) | ORAL | 0 refills | Status: DC

## 2018-10-05 MED ORDER — ATORVASTATIN CALCIUM 10 MG PO TABS: 10.0000 mg | ORAL_TABLET | Freq: Every day | ORAL | 1 refills | Status: DC

## 2018-10-05 NOTE — Progress Notes (Signed)
Shay   

## 2018-10-05 NOTE — Patient Instructions (Addendum)
Start cholesterol med once per day and recheck in 6 weeks for repeat labs.   I will check diabetes test. If over 7, then may need to consider a new medicine.   No change in blood pressure med today.   Please schedule appointment with eye doctor.   mucinex for cough.  Short term sudafed if needed for few days for congestion (but this can increase your blood pressure).   If any increased ear pain/symptoms - can fill antibiotic.   Return to the clinic or go to the nearest emergency room if any of your symptoms worsen or new symptoms occur.  Recheck in 6 weeks.     Upper Respiratory Infection, Adult Most upper respiratory infections (URIs) are caused by a virus. A URI affects the nose, throat, and upper air passages. The most common type of URI is often called "the common cold." Follow these instructions at home:  Take medicines only as told by your doctor.  Gargle warm saltwater or take cough drops to comfort your throat as told by your doctor.  Use a warm mist humidifier or inhale steam from a shower to increase air moisture. This may make it easier to breathe.  Drink enough fluid to keep your pee (urine) clear or pale yellow.  Eat soups and other clear broths.  Have a healthy diet.  Rest as needed.  Go back to work when your fever is gone or your doctor says it is okay. ? You may need to stay home longer to avoid giving your URI to others. ? You can also wear a face mask and wash your hands often to prevent spread of the virus.  Use your inhaler more if you have asthma.  Do not use any tobacco products, including cigarettes, chewing tobacco, or electronic cigarettes. If you need help quitting, ask your doctor. Contact a doctor if:  You are getting worse, not better.  Your symptoms are not helped by medicine.  You have chills.  You are getting more short of breath.  You have brown or red mucus.  You have yellow or brown discharge from your nose.  You have  pain in your face, especially when you bend forward.  You have a fever.  You have puffy (swollen) neck glands.  You have pain while swallowing.  You have white areas in the back of your throat. Get help right away if:  You have very bad or constant: ? Headache. ? Ear pain. ? Pain in your forehead, behind your eyes, and over your cheekbones (sinus pain). ? Chest pain.  You have long-lasting (chronic) lung disease and any of the following: ? Wheezing. ? Long-lasting cough. ? Coughing up blood. ? A change in your usual mucus.  You have a stiff neck.  You have changes in your: ? Vision. ? Hearing. ? Thinking. ? Mood. This information is not intended to replace advice given to you by your health care provider. Make sure you discuss any questions you have with your health care provider. Document Released: 03/31/2008 Document Revised: 06/15/2016 Document Reviewed: 01/18/2014 Elsevier Interactive Patient Education  [employer]2018 Elsevier Inc.   If you have lab work done today you will be contacted with your lab results within the next 2 weeks.  If you have not heard from us then please contact us. The fastest way to get your results is to register for My Chart.   IF you received an x-ray today, you will receive an invoice from [facility]. Please contact  [facility] at [phone] with questions or concerns regarding your invoice.   IF you received labwork today, you will receive an invoice from [facility]. Please contact [facility] at [phone] with questions or concerns regarding your invoice.   Our billing staff will not be able to assist you with questions regarding bills from these companies.  You will be contacted with the lab results as soon as they are available. The fastest way to get your results is to activate your My Chart account. Instructions are located on the last page of this paperwork. If you have not heard from [place] regarding the results in 2 weeks,  please contact this office.

## 2018-10-05 NOTE — Progress Notes (Signed)
Subjective:    Patient ID: Lonnie Richardson, male    DOB: February 14, 1971, 47 y.o.   MRN: [id number]  HPI Lonnie Richardson is a 47 y.o. male Presents today for: Chief Complaint  Patient presents with  . Diabetes    f/u   . cold sx    ear fullness (1wk)  . Cough  . Sore Throat   Cough, sore throat: Started 1 week ago with cough, nasal congestion, popping in ears. No fever, no body aches. Less cough, feeling better. Did notice some pink color to mucus past 2 days.  No history of tobacco abuse.  No wt loss, night sweats.  Wife similar symptoms.   Tx: mucinex max-  6 tablets total.  No flu shot yet this year. Plans to get next week. Declines today.   Diabetes: Lab Results  Component Value Date   HGBA1C 7.0 (H) 06/15/2018   HGBA1C 8.5 (H) 05/29/2016   HGBA1C 7.6 08/11/2015   Lab Results  Component Value Date   MICROALBUR 1.7 09/04/2016   LDLCALC 124 (H) 06/15/2018   CREATININE 0.94 07/17/2018  Diet/exercise approach.  Improved A1c last visit, not currently on meds.  Was tolerating lisinopril 5 mg daily for BP last visit without new cough. No cough since last visit (other than current illness).  LDL has decreased from 1 46-1 24 with diet and exercise.  Not currently on statin. Due for urine microalbumin, ophthalmology exam.  Foot exam 06/15/2018.  Pneumovax 07/17/2018.  BP Readings from Last 3 Encounters:  10/05/18 125/83  08/23/18 (!) 140/95  07/19/18 137/89   Wt Readings from Last 3 Encounters:  10/05/18 176 lb 12.8 oz (80.2 kg)  08/23/18 170 lb (77.1 kg)  07/19/18 170 lb (77.1 kg)       Patient Active Problem List   Diagnosis Date Noted  . Family history of early CAD 11/09/2014  . Elevated blood sugar 11/09/2014  . Abnormal ECG 11/09/2014  . HTN (hypertension) 01/14/2012   Past Medical History:  Diagnosis Date  . Diabetes mellitus without complication (HCC)   . Hypertension    Past Surgical History:  Procedure Laterality Date  . none     No Known  Allergies Prior to Admission medications   Medication Sig Start Date End Date Taking? Authorizing Provider  lisinopril (PRINIVIL,ZESTRIL) 5 MG tablet Take 1 tablet (5 mg total) by mouth daily. 07/17/18  Yes Shade Flood, MD   Social History   Socioeconomic History  . Marital status: Married    Spouse name: Not on file  . Number of children: 1  . Years of education: Not on file  . Highest education level: Not on file  Occupational History  . Not on file  Social Needs  . Financial resource strain: Not on file  . Food insecurity:    Worry: Not on file    Inability: Not on file  . Transportation needs:    Medical: Not on file    Non-medical: Not on file  Tobacco Use  . Smoking status: Never Smoker  . Smokeless tobacco: Never Used  Substance and Sexual Activity  . Alcohol use: Yes  . Drug use: No  . Sexual activity: Yes  Lifestyle  . Physical activity:    Days per week: Not on file    Minutes per session: Not on file  . Stress: Not on file  Relationships  . Social connections:    Talks on phone: Not on file    Gets together: Not on file  Attends religious service: Not on file    Active member of club or organization: Not on file    Attends meetings of clubs or organizations: Not on file    Relationship status: Not on file  . Intimate partner violence:    Fear of current or ex partner: Not on file    Emotionally abused: Not on file    Physically abused: Not on file    Forced sexual activity: Not on file  Other Topics Concern  . Not on file  Social History Narrative  . Not on file    Review of Systems Per hpi.     Objective:   Physical Exam  Constitutional: He is oriented to person, place, and time. He appears well-developed and well-nourished.  HENT:  Head: Normocephalic and atraumatic.  Right Ear: External ear and ear canal normal. Tympanic membrane is injected. A middle ear effusion is present.  Left Ear: Tympanic membrane, external ear and ear canal  normal.  Nose: No rhinorrhea.  Mouth/Throat: Oropharynx is clear and moist and mucous membranes are normal. No oropharyngeal exudate or posterior oropharyngeal erythema.  Right TM slightly dull, with possible clear effusion at base.  Injected/minimal erythema.  Canals clear, no pain with pinna motion  Eyes: Pupils are equal, round, and reactive to light. Conjunctivae are normal.  Neck: Neck supple.  Cardiovascular: Normal rate, regular rhythm, normal heart sounds and intact distal pulses.  No murmur heard. Pulmonary/Chest: Effort normal and breath sounds normal. He has no wheezes. He has no rhonchi. He has no rales.  Abdominal: Soft. There is no tenderness.  Lymphadenopathy:    He has no cervical adenopathy.  Neurological: He is alert and oriented to person, place, and time.  Skin: Skin is warm and dry. No rash noted.  Psychiatric: He has a normal mood and affect. His behavior is normal.  Vitals reviewed.  Vitals:   10/05/18 0929  BP: 125/83  Pulse: (!) 57  Temp: 99.2 F (37.3 C)  TempSrc: Oral  SpO2: 100%  Weight: 176 lb 12.8 oz (80.2 kg)  Height: 5\' 8"  (1.727 m)     Results for orders placed or performed in visit on 10/05/18  POCT rapid strep A  Result Value Ref Range   Rapid Strep A Screen Negative Negative       Assessment & Plan:    Lonnie Richardson is a 47 y.o. male Acute upper respiratory infection Right otitis media, unspecified otitis media type - Plan: amoxicillin (AMOXIL) 875 MG tablet Sore throat - Plan: POCT rapid strep A, Culture, Group A Strep  -1 week of symptoms, improving.   -Continue symptomatic care discussed, short-term decongestant for pressure in ears if needed, risks including impact on blood pressure discussed.  -  Possible early right otitis media, but as symptoms are improving can hold on antibiotics for now.  Printed amoxicillin if worsening symptoms and RTC precautions.  Diabetes mellitus type 2, diet-controlled (HCC) - Plan: Microalbumin  / creatinine urine ratio, Hemoglobin A1c  -Check urine microalbumin, A1c.  Continue diet approach for now.  Essential hypertension - Plan: lisinopril (PRINIVIL,ZESTRIL) 5 MG tablet  -Stable, tolerating low-dose lisinopril.  No changes  Hyperlipidemia, unspecified hyperlipidemia type - Plan: atorvastatin (LIPITOR) 10 MG tablet, Lipid panel, Comprehensive metabolic panel  -With history of diabetes will start low-dose Lipitor, check baseline lipids today and then recheck in 6 weeks.   Meds ordered this encounter  Medications  . atorvastatin (LIPITOR) 10 MG tablet    Sig: Take 1 tablet (  10 mg total) by mouth daily at 6 PM.    Dispense:  90 tablet    Refill:  1  . amoxicillin (AMOXIL) 875 MG tablet    Sig: Take 1 tablet (875 mg total) by mouth 2 (two) times daily.    Dispense:  20 tablet    Refill:  0  . lisinopril (PRINIVIL,ZESTRIL) 5 MG tablet    Sig: Take 1 tablet (5 mg total) by mouth daily.    Dispense:  90 tablet    Refill:  1   Patient Instructions     Start cholesterol med once per day and recheck in 6 weeks for repeat labs.   I will check diabetes test. If over 7, then may need to consider a new medicine.   No change in blood pressure med today.   Please schedule appointment with eye doctor.   mucinex for cough.  Short term sudafed if needed for few days for congestion (but this can increase your blood pressure).   If any increased ear pain/symptoms - can fill antibiotic.   Return to the clinic or go to the nearest emergency room if any of your symptoms worsen or new symptoms occur.  Recheck in 6 weeks.     Upper Respiratory Infection, Adult Most upper respiratory infections (URIs) are caused by a virus. A URI affects the nose, throat, and upper air passages. The most common type of URI is often called "the common cold." Follow these instructions at home:  Take medicines only as told by your doctor.  Gargle warm saltwater or take cough drops to comfort your  throat as told by your doctor.  Use a warm mist humidifier or inhale steam from a shower to increase air moisture. This may make it easier to breathe.  Drink enough fluid to keep your pee (urine) clear or pale yellow.  Eat soups and other clear broths.  Have a healthy diet.  Rest as needed.  Go back to work when your fever is gone or your doctor says it is okay. ? You may need to stay home longer to avoid giving your URI to others. ? You can also wear a face mask and wash your hands often to prevent spread of the virus.  Use your inhaler more if you have asthma.  Do not use any tobacco products, including cigarettes, chewing tobacco, or electronic cigarettes. If you need help quitting, ask your doctor. Contact a doctor if:  You are getting worse, not better.  Your symptoms are not helped by medicine.  You have chills.  You are getting more short of breath.  You have brown or red mucus.  You have yellow or brown discharge from your nose.  You have pain in your face, especially when you bend forward.  You have a fever.  You have puffy (swollen) neck glands.  You have pain while swallowing.  You have white areas in the back of your throat. Get help right away if:  You have very bad or constant: ? Headache. ? Ear pain. ? Pain in your forehead, behind your eyes, and over your cheekbones (sinus pain). ? Chest pain.  You have long-lasting (chronic) lung disease and any of the following: ? Wheezing. ? Long-lasting cough. ? Coughing up blood. ? A change in your usual mucus.  You have a stiff neck.  You have changes in your: ? Vision. ? Hearing. ? Thinking. ? Mood. This information is not intended to replace advice given to you by your  health care provider. Make sure you discuss any questions you have with your health care provider. Document Released: 03/31/2008 Document Revised: 06/15/2016 Document Reviewed: 01/18/2014 Elsevier Interactive Patient Education   [employer]2018 [employer].   If you have lab work done today you will be contacted with your lab results within the next 2 weeks.  If you have not heard from us then please contact us. The fastest way to get your results is to register for My Chart.   IF you received an x-ray today, you will receive an invoice from [facility]. Please contact [facility] at [phone] with questions or concerns regarding your invoice.   IF you received labwork today, you will receive an invoice from [facility]. Please contact [facility] at [phone] with questions or concerns regarding your invoice.   Our billing staff will not be able to assist you with questions regarding bills from these companies.  You will be contacted with the lab results as soon as they are available. The fastest way to get your results is to activate your My Chart account. Instructions are located on the last page of this paperwork. If you have not heard from us regarding the results in 2 weeks, please contact this office.       Signed,   Meredith StaggersJeffrey Aurther Harlin, MD Primary [facility] at [facility].  10/05/18 10:33 AM

## 2018-10-06 LAB — COMPREHENSIVE METABOLIC PANEL
ALBUMIN: 4.5 g/dL (ref 3.5–5.5)
ALK PHOS: 54 IU/L (ref 39–117)
ALT: 21 IU/L (ref 0–44)
AST: 14 IU/L (ref 0–40)
Albumin/Globulin Ratio: 1.8 (ref 1.2–2.2)
BUN / CREAT RATIO: 9 (ref 9–20)
BUN: 9 mg/dL (ref 6–24)
Bilirubin Total: 0.4 mg/dL (ref 0.0–1.2)
CALCIUM: 9.4 mg/dL (ref 8.7–10.2)
CO2: 23 mmol/L (ref 20–29)
CREATININE: 0.96 mg/dL (ref 0.76–1.27)
Chloride: 99 mmol/L (ref 96–106)
GFR calc Af Amer: 110 mL/min/{1.73_m2} (ref >59)
GFR, EST NON AFRICAN AMERICAN: 95 mL/min/{1.73_m2} (ref >59)
Globulin, Total: 2.5 g/dL (ref 1.5–4.5)
Glucose: 114 mg/dL — ABNORMAL HIGH (ref 65–99)
Potassium: 4.2 mmol/L (ref 3.5–5.2)
Sodium: 138 mmol/L (ref 134–144)
Total Protein: 7 g/dL (ref 6.0–8.5)

## 2018-10-06 LAB — LIPID PANEL
CHOLESTEROL TOTAL: 187 mg/dL (ref 100–199)
Chol/HDL Ratio: 4.8 ratio (ref 0.0–5.0)
HDL: 39 mg/dL — ABNORMAL LOW (ref >39)
LDL Calculated: 127 mg/dL — ABNORMAL HIGH (ref 0–99)
Triglycerides: 103 mg/dL (ref 0–149)
VLDL CHOLESTEROL CAL: 21 mg/dL (ref 5–40)

## 2018-10-06 LAB — HEMOGLOBIN A1C
ESTIMATED AVERAGE GLUCOSE: 163 mg/dL
HEMOGLOBIN A1C: 7.3 % — AB (ref 4.8–5.6)

## 2018-10-06 LAB — MICROALBUMIN / CREATININE URINE RATIO
CREATININE, UR: 115 mg/dL
Microalbumin, Urine: <3 ug/mL

## 2018-10-08 LAB — CULTURE, GROUP A STREP

## 2018-10-14 ENCOUNTER — Telehealth: Payer: Self-pay | Admitting: Family Medicine

## 2018-10-14 NOTE — Telephone Encounter (Unsigned)
Copied from CRM [id number]. Topic: General - Other >> Oct 14, 2018  3:25 PM Percival SpanishKennedy, Cheryl W wrote:  Pt returned Lonnie FanningJulie call would like a call back his wants to know about his A1C

## 2018-10-15 NOTE — Telephone Encounter (Signed)
Patient spoke with Raynelle FanningJulie the other day and wanted to know A1C result again, patient was informed that his A1c was 7.3

## 2018-11-25 ENCOUNTER — Ambulatory Visit: Payer: Self-pay | Admitting: Family Medicine

## 2019-08-15 ENCOUNTER — Telehealth: Payer: Self-pay | Admitting: Family Medicine

## 2019-08-15 NOTE — Telephone Encounter (Signed)
Patient is calling to schedule an appt for Flu shot, blood sugar, and blood pressure with Dr. Carlota Raspberry [phone]

## 2019-08-24 ENCOUNTER — Encounter: Payer: Self-pay | Admitting: Family Medicine

## 2019-08-24 ENCOUNTER — Other Ambulatory Visit: Payer: Self-pay

## 2019-08-24 ENCOUNTER — Ambulatory Visit (INDEPENDENT_AMBULATORY_CARE_PROVIDER_SITE_OTHER): Payer: Self-pay | Admitting: Family Medicine

## 2019-08-24 VITALS — BP 123/81 | HR 58 | Temp 97.7°F | Resp 12 | Ht 68.0 in | Wt 151.0 lb

## 2019-08-24 DIAGNOSIS — I10 Essential (primary) hypertension: Secondary | ICD-10-CM

## 2019-08-24 DIAGNOSIS — E785 Hyperlipidemia, unspecified: Secondary | ICD-10-CM

## 2019-08-24 DIAGNOSIS — Z23 Encounter for immunization: Secondary | ICD-10-CM

## 2019-08-24 DIAGNOSIS — E119 Type 2 diabetes mellitus without complications: Secondary | ICD-10-CM

## 2019-08-24 MED ORDER — LISINOPRIL 2.5 MG PO TABS: 2.5000 mg | ORAL_TABLET | Freq: Every day | ORAL | 2 refills | Status: DC

## 2019-08-24 MED ORDER — ATORVASTATIN CALCIUM 10 MG PO TABS: 10.0000 mg | ORAL_TABLET | Freq: Every day | ORAL | 2 refills | Status: DC

## 2019-08-24 NOTE — Progress Notes (Signed)
Subjective:    Patient ID: Lonnie Richardson, male    DOB: 1971-02-18, 48 y.o.   MRN: [id number]  HPI Lonnie Richardson is a 48 y.o. male Presents today for: Chief Complaint  Patient presents with   Hypertension  Here for follow-up of chronic medical issues, has not been seen since December 2019. Denies any barriers to care.   Hypertension: BP Readings from Last 3 Encounters:  08/24/19 123/81  10/05/18 125/83  08/23/18 (!) 140/95   Lab Results  Component Value Date   CREATININE 0.96 10/05/2018  Lisinopril 5 mg daily.  No recent new side effects.  Only taking once every 3 days as intentional weight loss, but no measured low readings, not lightheadedness.  Last took lisinopril 3 days ago.   Wt Readings from Last 3 Encounters:  08/24/19 151 lb (68.5 kg)  10/05/18 176 lb 12.8 oz (80.2 kg)  08/23/18 170 lb (77.1 kg)    Hyperlipidemia:  Lab Results  Component Value Date   CHOL 187 10/05/2018   HDL 39 (L) 10/05/2018   LDLCALC 127 (H) 10/05/2018   TRIG 103 10/05/2018   CHOLHDL 4.8 10/05/2018   Lab Results  Component Value Date   ALT 21 10/05/2018   AST 14 10/05/2018   ALKPHOS 54 10/05/2018   BILITOT 0.4 10/05/2018  Lipitor 10 mg daily rx -  Not taking recently. No side effects on meds - just ran out.   Diabetes: Diet/exercise approach previously.  A1c slightly increased last December.   On ACE, statin as above.  Past few months has eating more raw foods, adjusted portions.  No night sweats/fevers.   Wt Readings from Last 3 Encounters:  08/24/19 151 lb (68.5 kg)  10/05/18 176 lb 12.8 oz (80.2 kg)  08/23/18 170 lb (77.1 kg)    Microalbumin: Normal ratio December 2019. Optho, foot exam, pneumovax: Due for ophthalmology exam, foot exam.  Lab Results  Component Value Date   HGBA1C 7.3 (H) 10/05/2018   HGBA1C 7.0 (H) 06/15/2018   HGBA1C 8.5 (H) 05/29/2016   Lab Results  Component Value Date   MICROALBUR 1.7 09/04/2016   [place] 127 (H) 10/05/2018   CREATININE 0.96 10/05/2018      Patient Active Problem List   Diagnosis Date Noted   Family history of early CAD 11/09/2014   Elevated blood sugar 11/09/2014   Abnormal ECG 11/09/2014   HTN (hypertension) 01/14/2012   Past Medical History:  Diagnosis Date   Diabetes mellitus without complication ([place])    Hypertension    Past Surgical History:  Procedure Laterality Date   none     No Known Allergies Prior to Admission medications   Medication Sig Start Date End Date Taking? Authorizing Provider  atorvastatin (LIPITOR) 10 MG tablet Take 1 tablet (10 mg total) by mouth daily at 6 PM. 10/05/18  Yes Wendie Agreste, MD  lisinopril (PRINIVIL,ZESTRIL) 5 MG tablet Take 1 tablet (5 mg total) by mouth daily. 10/05/18  Yes Wendie Agreste, MD   Social History   Socioeconomic History   Marital status: Married    Spouse name: Not on file   Number of children: 1   Years of education: Not on file   Highest education level: Not on file  Occupational History   Not on file  Social Needs   Financial resource strain: Not on file   Food insecurity    Worry: Not on file    Inability: Not on file   Transportation needs    Medical:  Not on file    Non-medical: Not on file  Tobacco Use   Smoking status: Never Smoker   Smokeless tobacco: Never Used  Substance and Sexual Activity   Alcohol use: Yes   Drug use: No   Sexual activity: Yes  Lifestyle   Physical activity    Days per week: Not on file    Minutes per session: Not on file   Stress: Not on file  Relationships   Social connections    Talks on phone: Not on file    Gets together: Not on file    Attends religious service: Not on file    Active member of club or organization: Not on file    Attends meetings of clubs or organizations: Not on file    Relationship status: Not on file   Intimate partner violence    Fear of current or ex partner: Not on file    Emotionally abused: Not on file     Physically abused: Not on file    Forced sexual activity: Not on file  Other Topics Concern   Not on file  Social History Narrative   Not on file    Review of Systems  Constitutional: Negative for fatigue and unexpected weight change.  Eyes: Negative for visual disturbance.  Respiratory: Negative for cough, chest tightness and shortness of breath.   Cardiovascular: Negative for chest pain, palpitations and leg swelling.  Gastrointestinal: Negative for abdominal pain and blood in stool.  Neurological: Negative for dizziness, light-headedness and headaches.       Objective:   Physical Exam Vitals signs reviewed.  Constitutional:      Appearance: He is well-developed.  HENT:     Head: Normocephalic and atraumatic.  Eyes:     Pupils: Pupils are equal, round, and reactive to light.  Neck:     Vascular: No carotid bruit or JVD.  Cardiovascular:     Rate and Rhythm: Normal rate and regular rhythm.     Heart sounds: Normal heart sounds. No murmur.  Pulmonary:     Effort: Pulmonary effort is normal.     Breath sounds: Normal breath sounds. No rales.  Skin:    General: Skin is warm and dry.  Neurological:     Mental Status: He is alert and oriented to person, place, and time.    Vitals:   08/24/19 0812  BP: 123/81  Pulse: (!) 58  Resp: 12  Temp: 97.7 F (36.5 C)  SpO2: 100%  Weight: 151 lb (68.5 kg)  Height: _0  (1.727 m)          Assessment & Plan:    Lonnie Richardson is a 48 y.o. male Diabetes mellitus type 2, diet-controlled ([place]) - Plan: Hemoglobin A1c, Ambulatory referral to Ophthalmology  -Check A1c, expect improvement with weight loss.  Referral to ophthalmology for diabetic retinopathy screening   Need for immunization against influenza - Plan: Flu Vaccine QUAD 36+ mos IM  Hyperlipidemia, unspecified hyperlipidemia type - Plan: Lipid panel, atorvastatin (LIPITOR) 10 MG tablet  -Check lipid level, but likely will still need some form of statin with  history of diabetes.  Intermittent dosing also option  Essential hypertension - Plan: Comprehensive metabolic panel, lisinopril (ZESTRIL) 2.5 MG tablet  -Stable off meds, option of restarting lisinopril low-dose if elevated readings.  Meds ordered this encounter  Medications   lisinopril (ZESTRIL) 2.5 MG tablet    Sig: Take 1 tablet (2.5 mg total) by mouth daily.    Dispense:  90  tablet    Refill:  2   atorvastatin (LIPITOR) 10 MG tablet    Sig: Take 1 tablet (10 mg total) by mouth daily at 6 PM.    Dispense:  90 tablet    Refill:  2   Patient Instructions  Keep a record of your blood pressures outside of the office off the blood pressure meds and let me know the readings.  If blood pressure is over 130/80 - can start back at 2.39m dose of lisinopril.   I suspect the bloodwork will look better, but still recommend stain once per day with diabetes.   Good job on diet and weight loss. I will check diabetes test, but also refer you to have diabetic eye screen.    Type 2 Diabetes Mellitus, Self Care, Adult When you have type 2 diabetes (type 2 diabetes mellitus), you must make sure your blood sugar (glucose) stays in a healthy range. You can do this with:  Nutrition.  Exercise.  Lifestyle changes.  Medicines or insulin, if needed.  Support from your doctors and others. How to stay aware of blood sugar   Check your blood sugar level every day, as often as told.  Have your A1c (hemoglobin A1c) level checked two or more times a year. Have it checked more often if your doctor tells you to. Your doctor will set personal treatment goals for you. Generally, you should have these blood sugar levels:  Before meals (preprandial): 80-130 mg/dL (4.4-7.2 mmol/L).  After meals (postprandial): below 180 mg/dL (10 mmol/L).  A1c level: less than 7%. How to manage high and low blood sugar Signs of high blood sugar High blood sugar is called hyperglycemia. Know the signs of high blood  sugar. Signs may include:  Feeling: ? Thirsty. ? Hungry. ? Very tired.  Needing to pee (urinate) more than usual.  Blurry vision. Signs of low blood sugar Low blood sugar is called hypoglycemia. This is when blood sugar is at or below 70 mg/dL (3.9 mmol/L). Signs may include:  Feeling: ? Hungry. ? Worried or nervous (anxious). ? Sweaty and clammy. ? Confused. ? Dizzy. ? Sleepy. ? Sick to your stomach (nauseous).  Having: ? A fast heartbeat. ? A headache. ? A change in your vision. ? Jerky movements that you cannot control (seizure). ? Tingling or no feeling (numbness) around your mouth, lips, or tongue.  Having trouble with: ? Moving (coordination). ? Sleeping. ? Passing out (fainting). ? Getting upset easily (irritability). Treating low blood sugar To treat low blood sugar, eat or drink something sugary right away. If you can think clearly and swallow safely, follow the 15:15 rule:  Take 15 grams of a fast-acting carb (carbohydrate). Talk with your doctor about how much you should take.  Some fast-acting carbs are: ? Sugar tablets (glucose pills). Take 3-4 pills. ? 6-8 pieces of hard candy. ? 4-6 oz (120-150 mL) of fruit juice. ? 4-6 oz (120-150 mL) of regular (not diet) soda. ? 1 Tbsp (15 mL) honey or sugar.  Check your blood sugar 15 minutes after you take the carb.  If your blood sugar is still at or below 70 mg/dL (3.9 mmol/L), take 15 grams of a carb again.  If your blood sugar does not go above 70 mg/dL (3.9 mmol/L) after 3 tries, get help right away.  After your blood sugar goes back to normal, eat a meal or a snack within 1 hour. Treating very low blood sugar If your blood sugar is at or  below 54 mg/dL (3 mmol/L), you have very low blood sugar (severe hypoglycemia). This is an emergency. Do not wait to see if the symptoms will go away. Get medical help right away. Call your local emergency services (911 in the [place] If you have very low blood sugar and  you cannot eat or drink, you may need a glucagon shot (injection). A family member or friend should learn how to check your blood sugar and how to give you a glucagon shot. Ask your doctor if you need to have a glucagon shot kit at home. Follow these instructions at home: Medicine  Take insulin and diabetes medicines as told.  If your doctor says you should take more or less insulin and medicines, do this exactly as told.  Do not run out of insulin or medicines. Having diabetes can raise your risk for other long-term conditions. These include heart disease and kidney disease. Your doctor may prescribe medicines to help you not have these problems. Food   Make healthy food choices. These include: ? Chicken, fish, egg whites, and beans. ? Oats, whole wheat, bulgur, brown rice, quinoa, and millet. ? Fresh fruits and vegetables. ? Low-fat dairy products. ? Nuts, avocado, olive oil, and canola oil.  Meet with a food specialist (dietitian). He or she can help you make an eating plan that is right for you.  Follow instructions from your doctor about what you cannot eat or drink.  Drink enough fluid to keep your pee (urine) pale yellow.  Keep track of carbs that you eat. Do this by reading food labels and learning food serving sizes.  Follow your sick day plan when you cannot eat or drink normally. Make this plan with your doctor so it is ready to use. Activity  Exercise 3 or more times a week.  Do not go more than 2 days without exercising.  Talk with your doctor before you start a new exercise. Your doctor may need to tell you to change: ? How much insulin or medicines you take. ? How much food you eat. Lifestyle  Do not use any tobacco products. These include cigarettes, chewing tobacco, and e-cigarettes. If you need help quitting, ask your doctor.  Ask your doctor how much alcohol is safe for you.  Learn to deal with stress. If you need help with this, ask your doctor. Body  care   Stay up to date with your shots (immunizations).  Have your eyes and feet checked by a doctor as often as told.  Check your skin and feet every day. Check for cuts, bruises, redness, blisters, or sores.  Brush your teeth and gums two times a day. Floss one or more times a day.  Go to the dentist one or more times every 6 months.  Stay at a healthy weight. General instructions  Take over-the-counter and prescription medicines only as told by your doctor.  Share your diabetes care plan with: ? Your work or school. ? People you live with.  Carry a card or wear jewelry that says you have diabetes.  Keep all follow-up visits as told by your doctor. This is important. Questions to ask your doctor  Do I need to meet with a diabetes educator?  Where can I find a support group for people with diabetes? Where to find more information To learn more about diabetes, visit:  American Diabetes Association: www.diabetes.org  American Association of Diabetes Educators: www.diabeteseducator.org Summary  When you have type 2 diabetes, you must make sure  your blood sugar (glucose) stays in a healthy range.  Check your blood sugar every day, as often as told.  Having diabetes can raise your risk for other conditions. Your doctor may prescribe medicines to help you not have these problems.  Keep all follow-up visits as told by your doctor. This is important. This information is not intended to replace advice given to you by your health care provider. Make sure you discuss any questions you have with your health care provider. Document Released: 02/04/2016 Document Revised: 04/05/2018 Document Reviewed: 11/16/2015 Elsevier Patient Education  2020 [place],   [name], MD [facility] [place].  08/24/19 7:44 PM

## 2019-08-24 NOTE — Patient Instructions (Addendum)
Keep a record of your blood pressures outside of the office off the blood pressure meds and let me know the readings.  If blood pressure is over 130/80 - can start back at 2.16m dose of lisinopril.   I suspect the bloodwork will look better, but still recommend stain once per day with diabetes.   Good job on diet and weight loss. I will check diabetes test, but also refer you to have diabetic eye screen.    Type 2 Diabetes Mellitus, Self Care, Adult When you have type 2 diabetes (type 2 diabetes mellitus), you must make sure your blood sugar (glucose) stays in a healthy range. You can do this with:  Nutrition.  Exercise.  Lifestyle changes.  Medicines or insulin, if needed.  Support from your doctors and others. How to stay aware of blood sugar   Check your blood sugar level every day, as often as told.  Have your A1c (hemoglobin A1c) level checked two or more times a year. Have it checked more often if your doctor tells you to. Your doctor will set personal treatment goals for you. Generally, you should have these blood sugar levels:  Before meals (preprandial): 80-130 mg/dL (4.4-7.2 mmol/L).  After meals (postprandial): below 180 mg/dL (10 mmol/L).  A1c level: less than 7%. How to manage high and low blood sugar Signs of high blood sugar High blood sugar is called hyperglycemia. Know the signs of high blood sugar. Signs may include:  Feeling: ? Thirsty. ? Hungry. ? Very tired.  Needing to pee (urinate) more than usual.  Blurry vision. Signs of low blood sugar Low blood sugar is called hypoglycemia. This is when blood sugar is at or below 70 mg/dL (3.9 mmol/L). Signs may include:  Feeling: ? Hungry. ? Worried or nervous (anxious). ? Sweaty and clammy. ? Confused. ? Dizzy. ? Sleepy. ? Sick to your stomach (nauseous).  Having: ? A fast heartbeat. ? A headache. ? A change in your vision. ? Jerky movements that you cannot control (seizure). ? Tingling or no  feeling (numbness) around your mouth, lips, or tongue.  Having trouble with: ? Moving (coordination). ? Sleeping. ? Passing out (fainting). ? Getting upset easily (irritability). Treating low blood sugar To treat low blood sugar, eat or drink something sugary right away. If you can think clearly and swallow safely, follow the 15:15 rule:  Take 15 grams of a fast-acting carb (carbohydrate). Talk with your doctor about how much you should take.  Some fast-acting carbs are: ? Sugar tablets (glucose pills). Take 3-4 pills. ? 6-8 pieces of hard candy. ? 4-6 oz (120-150 mL) of fruit juice. ? 4-6 oz (120-150 mL) of regular (not diet) soda. ? 1 Tbsp (15 mL) honey or sugar.  Check your blood sugar 15 minutes after you take the carb.  If your blood sugar is still at or below 70 mg/dL (3.9 mmol/L), take 15 grams of a carb again.  If your blood sugar does not go above 70 mg/dL (3.9 mmol/L) after 3 tries, get help right away.  After your blood sugar goes back to normal, eat a meal or a snack within 1 hour. Treating very low blood sugar If your blood sugar is at or below 54 mg/dL (3 mmol/L), you have very low blood sugar (severe hypoglycemia). This is an emergency. Do not wait to see if the symptoms will go away. Get medical help right away. Call your local emergency services (911 in the [place] If you have very low blood  sugar and you cannot eat or drink, you may need a glucagon shot (injection). A family member or friend should learn how to check your blood sugar and how to give you a glucagon shot. Ask your doctor if you need to have a glucagon shot kit at home. Follow these instructions at home: Medicine  Take insulin and diabetes medicines as told.  If your doctor says you should take more or less insulin and medicines, do this exactly as told.  Do not run out of insulin or medicines. Having diabetes can raise your risk for other long-term conditions. These include heart disease and kidney  disease. Your doctor may prescribe medicines to help you not have these problems. Food   Make healthy food choices. These include: ? Chicken, fish, egg whites, and beans. ? Oats, whole wheat, bulgur, brown rice, quinoa, and millet. ? Fresh fruits and vegetables. ? Low-fat dairy products. ? Nuts, avocado, olive oil, and canola oil.  Meet with a food specialist (dietitian). He or she can help you make an eating plan that is right for you.  Follow instructions from your doctor about what you cannot eat or drink.  Drink enough fluid to keep your pee (urine) pale yellow.  Keep track of carbs that you eat. Do this by reading food labels and learning food serving sizes.  Follow your sick day plan when you cannot eat or drink normally. Make this plan with your doctor so it is ready to use. Activity  Exercise 3 or more times a week.  Do not go more than 2 days without exercising.  Talk with your doctor before you start a new exercise. Your doctor may need to tell you to change: ? How much insulin or medicines you take. ? How much food you eat. Lifestyle  Do not use any tobacco products. These include cigarettes, chewing tobacco, and e-cigarettes. If you need help quitting, ask your doctor.  Ask your doctor how much alcohol is safe for you.  Learn to deal with stress. If you need help with this, ask your doctor. Body care   Stay up to date with your shots (immunizations).  Have your eyes and feet checked by a doctor as often as told.  Check your skin and feet every day. Check for cuts, bruises, redness, blisters, or sores.  Brush your teeth and gums two times a day. Floss one or more times a day.  Go to the dentist one or more times every 6 months.  Stay at a healthy weight. General instructions  Take over-the-counter and prescription medicines only as told by your doctor.  Share your diabetes care plan with: ? Your work or school. ? People you live with.  Carry a card  or wear jewelry that says you have diabetes.  Keep all follow-up visits as told by your doctor. This is important. Questions to ask your doctor  Do I need to meet with a diabetes educator?  Where can I find a support group for people with diabetes? Where to find more information To learn more about diabetes, visit:  American Diabetes Association: www.diabetes.org  American Association of Diabetes Educators: www.diabeteseducator.org Summary  When you have type 2 diabetes, you must make sure your blood sugar (glucose) stays in a healthy range.  Check your blood sugar every day, as often as told.  Having diabetes can raise your risk for other conditions. Your doctor may prescribe medicines to help you not have these problems.  Keep all follow-up visits as  told by your doctor. This is important. This information is not intended to replace advice given to you by your health care provider. Make sure you discuss any questions you have with your health care provider. Document Released: 02/04/2016 Document Revised: 04/05/2018 Document Reviewed: 11/16/2015 Elsevier Patient Education  2020 [employer].

## 2019-08-25 LAB — LIPID PANEL
Chol/HDL Ratio: 3.8 ratio (ref 0.0–5.0)
Cholesterol, Total: 180 mg/dL (ref 100–199)
HDL: 48 mg/dL (ref >39)
LDL Chol Calc (NIH): 116 mg/dL — ABNORMAL HIGH (ref 0–99)
Triglycerides: 89 mg/dL (ref 0–149)
VLDL Cholesterol Cal: 16 mg/dL (ref 5–40)

## 2019-08-25 LAB — COMPREHENSIVE METABOLIC PANEL
ALT: 15 IU/L (ref 0–44)
AST: 12 IU/L (ref 0–40)
Albumin/Globulin Ratio: 2.3 — ABNORMAL HIGH (ref 1.2–2.2)
Albumin: 4.8 g/dL (ref 4.0–5.0)
Alkaline Phosphatase: 49 IU/L (ref 39–117)
BUN/Creatinine Ratio: 11 (ref 9–20)
BUN: 10 mg/dL (ref 6–24)
Bilirubin Total: 0.6 mg/dL (ref 0.0–1.2)
CO2: 22 mmol/L (ref 20–29)
Calcium: 9.8 mg/dL (ref 8.7–10.2)
Chloride: 104 mmol/L (ref 96–106)
Creatinine, Ser: 0.95 mg/dL (ref 0.76–1.27)
GFR calc Af Amer: 111 mL/min/{1.73_m2} (ref >59)
GFR calc non Af Amer: 96 mL/min/{1.73_m2} (ref >59)
Globulin, Total: 2.1 g/dL (ref 1.5–4.5)
Glucose: 90 mg/dL (ref 65–99)
Potassium: 5.1 mmol/L (ref 3.5–5.2)
Sodium: 140 mmol/L (ref 134–144)
Total Protein: 6.9 g/dL (ref 6.0–8.5)

## 2019-08-25 LAB — HEMOGLOBIN A1C
Est. average glucose Bld gHb Est-mCnc: 123 mg/dL
Hgb A1c MFr Bld: 5.9 % — ABNORMAL HIGH (ref 4.8–5.6)

## 2019-09-01 ENCOUNTER — Telehealth: Payer: Self-pay | Admitting: Family Medicine

## 2019-09-01 NOTE — Telephone Encounter (Signed)
Pt is calling concerning lab results from 08/24/19. Please advise at [phone]

## 2019-09-06 ENCOUNTER — Encounter: Payer: Self-pay | Admitting: Radiology

## 2019-12-20 LAB — HM DIABETES EYE EXAM

## 2020-02-22 ENCOUNTER — Ambulatory Visit: Payer: Self-pay | Admitting: Family Medicine

## 2020-02-27 ENCOUNTER — Encounter: Payer: Self-pay | Admitting: Family Medicine

## 2020-03-01 ENCOUNTER — Ambulatory Visit (INDEPENDENT_AMBULATORY_CARE_PROVIDER_SITE_OTHER): Payer: Self-pay | Admitting: Family Medicine

## 2020-03-01 ENCOUNTER — Encounter: Payer: Self-pay | Admitting: Family Medicine

## 2020-03-01 ENCOUNTER — Other Ambulatory Visit: Payer: Self-pay

## 2020-03-01 VITALS — BP 138/90 | HR 76 | Temp 97.3°F | Ht 68.0 in | Wt 160.0 lb

## 2020-03-01 DIAGNOSIS — E119 Type 2 diabetes mellitus without complications: Secondary | ICD-10-CM

## 2020-03-01 DIAGNOSIS — E785 Hyperlipidemia, unspecified: Secondary | ICD-10-CM

## 2020-03-01 DIAGNOSIS — E1169 Type 2 diabetes mellitus with other specified complication: Secondary | ICD-10-CM

## 2020-03-01 DIAGNOSIS — I10 Essential (primary) hypertension: Secondary | ICD-10-CM

## 2020-03-01 LAB — GLUCOSE, POCT (MANUAL RESULT ENTRY): POC Glucose: 77 mg/dl (ref 70–99)

## 2020-03-01 MED ORDER — ATORVASTATIN CALCIUM 10 MG PO TABS: 10.0000 mg | ORAL_TABLET | Freq: Every day | ORAL | 2 refills | Status: DC

## 2020-03-01 NOTE — Patient Instructions (Addendum)
For now take lisinopril daily.  I do recommend starting lipitor at least a few days per week to help with elevated cholesterol and with history of diabetes. Recheck in 6 months if labs look okay today.  Follow-up sooner if any concerns, thank you for coming in today.     If you have lab work done today you will be contacted with your lab results within the next 2 weeks.  If you have not heard from [place] then please contact us. The fastest way to get your results is to register for My Chart.   IF you received an x-ray today, you will receive an invoice from [facility]. Please contact [facility] at [phone] with questions or concerns regarding your invoice.   IF you received labwork today, you will receive an invoice from [place]. Please contact [facility] at [phone] with questions or concerns regarding your invoice.   Our billing staff will not be able to assist you with questions regarding bills from these companies.  You will be contacted with the lab results as soon as they are available. The fastest way to get your results is to activate your My Chart account. Instructions are located on the last page of this paperwork. If you have not heard from [place] regarding the results in 2 weeks, please contact this office.

## 2020-03-01 NOTE — Progress Notes (Signed)
Subjective:  Patient ID: Lonnie Richardson, male    DOB: 19-Jan-1971  Age: 49 y.o. MRN: [id number]  CC:  Chief Complaint  Patient presents with  . Follow-up    on hypertenstion, hyperlipidemia, and diabetes. pt states he hasn't checked his BS sice last visit, but states he feels its under control. pt states he dosen't check the his BP and he takes the medication once a week. pt reports no physical symptoms.pt would like a POC glucose     HPI Lonnie Richardson presents for   Diabetes: With hyperlipidemia, diet controlled.  Significant improvement with weight loss when discussed in October of last year. Some weight gain since last visit.  Exercising daily.  Still trying to watch diet, less intense than last time.  Wt Readings from Last 3 Encounters:  03/01/20 160 lb (72.6 kg)  08/24/19 151 lb (68.5 kg)  10/05/18 176 lb 12.8 oz (80.2 kg)    Immunization History  Administered Date(s) Administered  . Influenza,inj,Quad PF,6+ Mos 08/24/2019  . Pneumococcal Polysaccharide-23 07/17/2018  . Tdap 10/09/2014    Microalbumin: Normal ratio December 2019 Optho, foot exam, pneumovax: Up-to-date. No retinopathy on 2/21 visit.   Lab Results  Component Value Date   HGBA1C 5.9 (H) 08/24/2019   HGBA1C 7.3 (H) 10/05/2018   HGBA1C 7.0 (H) 06/15/2018   Lab Results  Component Value Date   MICROALBUR 1.7 09/04/2016   LDLCALC 116 (H) 08/24/2019   CREATININE 0.95 08/24/2019   Hyperlipidemia: Lipitor 10 mg daily recommended at August 24, 2019 visit. Not taking statin. Did not take statin. Tried ginger and garlic.  Lab Results  Component Value Date   CHOL 180 08/24/2019   HDL 48 08/24/2019   LDLCALC 116 (H) 08/24/2019   TRIG 89 08/24/2019   CHOLHDL 3.8 08/24/2019   Lab Results  Component Value Date   ALT 15 08/24/2019   AST 12 08/24/2019   ALKPHOS 49 08/24/2019   BILITOT 0.6 08/24/2019   Hypertension: Stable off meds in October 2020, option of restarting lisinopril 2.5 mg if  elevated readings. Taking lisinopril 3-4 days per week. Not measuring readings.  Took yesterday am.  BP Readings from Last 3 Encounters:  03/01/20 138/90  08/24/19 123/81  10/05/18 125/83   Lab Results  Component Value Date   CREATININE 0.95 08/24/2019    Question about posture, sometimes finds himself rotating forward but able to sit up straight, now shoulder pain, no change in appearance.  Plans to work on sitting up straight with improved posture.  History Patient Active Problem List   Diagnosis Date Noted  . Family history of early CAD 11/09/2014  . Elevated blood sugar 11/09/2014  . Abnormal ECG 11/09/2014  . HTN (hypertension) 01/14/2012   Past Medical History:  Diagnosis Date  . Diabetes mellitus without complication (Lonnie Richardson)   . Hypertension    Past Surgical History:  Procedure Laterality Date  . none     No Known Allergies Prior to Admission medications   Medication Sig Start Date End Date Taking? Authorizing Provider  lisinopril (ZESTRIL) 2.5 MG tablet Take 1 tablet (2.5 mg total) by mouth daily. 08/24/19  Yes Wendie Agreste, MD  atorvastatin (LIPITOR) 10 MG tablet Take 1 tablet (10 mg total) by mouth daily at 6 PM. Patient not taking: Reported on 03/01/2020 08/24/19   Wendie Agreste, MD   Social History   Socioeconomic History  . Marital status: Married    Spouse name: Not on file  . Number of children: 1  .  Years of education: Not on file  . Highest education level: Not on file  Occupational History  . Not on file  Tobacco Use  . Smoking status: Never Smoker  . Smokeless tobacco: Never Used  Substance and Sexual Activity  . Alcohol use: Yes  . Drug use: No  . Sexual activity: Yes  Other Topics Concern  . Not on file  Social History Narrative  . Not on file   Social Determinants of Health   Financial Resource Strain:   . Difficulty of Paying Living Expenses:   Food Insecurity:   . Worried About Programme researcher, broadcasting/film/video in the Last Year:   .  Barista in the Last Year:   Transportation Needs:   . Freight forwarder (Medical):   Marland Kitchen Lack of Transportation (Non-Medical):   Physical Activity:   . Days of Exercise per Week:   . Minutes of Exercise per Session:   Stress:   . Feeling of Stress :   Social Connections:   . Frequency of Communication with Friends and Family:   . Frequency of Social Gatherings with Friends and Family:   . Attends Religious Services:   . Active Member of Clubs or Organizations:   . Attends Banker Meetings:   Marland Kitchen Marital Status:   Intimate Partner Violence:   . Fear of Current or Ex-Partner:   . Emotionally Abused:   Marland Kitchen Physically Abused:   . Sexually Abused:     Review of Systems  Constitutional: Negative for fatigue and unexpected weight change.  Eyes: Negative for visual disturbance.  Respiratory: Negative for cough, chest tightness and shortness of breath.   Cardiovascular: Negative for chest pain, palpitations and leg swelling.  Gastrointestinal: Negative for abdominal pain and blood in stool.  Neurological: Negative for dizziness, light-headedness and headaches.     Objective:   Vitals:   03/01/20 1440  BP: 138/90  Pulse: 76  Temp: (!) 97.3 F (36.3 C)  TempSrc: Temporal  SpO2: 100%  Weight: 160 lb (72.6 kg)  Height: 5\' 8"  (1.727 m)    Physical Exam Vitals reviewed.  Constitutional:      Appearance: He is well-developed.  HENT:     Head: Normocephalic and atraumatic.  Eyes:     Pupils: Pupils are equal, round, and reactive to light.  Neck:     Vascular: No carotid bruit or JVD.  Cardiovascular:     Rate and Rhythm: Normal rate and regular rhythm.     Heart sounds: Normal heart sounds. No murmur.  Pulmonary:     Effort: Pulmonary effort is normal.     Breath sounds: Normal breath sounds. No rales.  Skin:    General: Skin is warm and dry.  Neurological:     Mental Status: He is alert and oriented to person, place, and time.       Results  for orders placed or performed in visit on 03/01/20  POCT glucose (manual entry)  Result Value Ref Range   POC Glucose 77 70 - 99 mg/dl    Assessment & Plan:  Lonnie Richardson is a 49 y.o. male . Diabetes mellitus type 2, diet-controlled (HCC) - Plan: Hemoglobin A1c, POCT glucose (manual entry)  -Previously well controlled with diet alone.  Minimal increase in weight.  Recheck A1c.  In office glucose reassuring  Hyperlipidemia, unspecified hyperlipidemia type - Plan: Lipid panel, Comprehensive metabolic panel  -Previous mild elevation, but with diabetes recommended Lipitor even if intermittent dosing.  Repeat  baseline labs.  Essential hypertension - Plan: Lipid panel, Comprehensive metabolic panel  -Borderline, recommended low-dose lisinopril daily.  Recheck 6 months  No orders of the defined types were placed in this encounter.  Patient Instructions       If you have lab work done today you will be contacted with your lab results within the next 2 weeks.  If you have not heard from [place] then please contact us. The fastest way to get your results is to register for My Chart.   IF you received an x-ray today, you will receive an invoice from [facility]. Please contact [facility] at [phone] with questions or concerns regarding your invoice.   IF you received labwork today, you will receive an invoice from [place]. Please contact [facility] at [phone] with questions or concerns regarding your invoice.   Our billing staff will not be able to assist you with questions regarding bills from these companies.  You will be contacted with the lab results as soon as they are available. The fastest way to get your results is to activate your My Chart account. Instructions are located on the last page of this paperwork. If you have not heard from [place] regarding the results in 2 weeks, please contact this office.         Signed, [name], MD [facility]

## 2020-03-02 LAB — COMPREHENSIVE METABOLIC PANEL
ALT: 22 IU/L (ref 0–44)
AST: 17 IU/L (ref 0–40)
Albumin/Globulin Ratio: 1.8 (ref 1.2–2.2)
Albumin: 4.6 g/dL (ref 4.0–5.0)
Alkaline Phosphatase: 51 IU/L (ref 39–117)
BUN/Creatinine Ratio: 11 (ref 9–20)
BUN: 10 mg/dL (ref 6–24)
Bilirubin Total: 0.6 mg/dL (ref 0.0–1.2)
CO2: 25 mmol/L (ref 20–29)
Calcium: 9.5 mg/dL (ref 8.7–10.2)
Chloride: 102 mmol/L (ref 96–106)
Creatinine, Ser: 0.87 mg/dL (ref 0.76–1.27)
GFR calc Af Amer: 120 mL/min/{1.73_m2} (ref >59)
GFR calc non Af Amer: 103 mL/min/{1.73_m2} (ref >59)
Globulin, Total: 2.6 g/dL (ref 1.5–4.5)
Glucose: 77 mg/dL (ref 65–99)
Potassium: 4.6 mmol/L (ref 3.5–5.2)
Sodium: 141 mmol/L (ref 134–144)
Total Protein: 7.2 g/dL (ref 6.0–8.5)

## 2020-03-02 LAB — LIPID PANEL
Chol/HDL Ratio: 4.2 ratio (ref 0.0–5.0)
Cholesterol, Total: 202 mg/dL — ABNORMAL HIGH (ref 100–199)
HDL: 48 mg/dL (ref >39)
LDL Chol Calc (NIH): 137 mg/dL — ABNORMAL HIGH (ref 0–99)
Triglycerides: 92 mg/dL (ref 0–149)
VLDL Cholesterol Cal: 17 mg/dL (ref 5–40)

## 2020-03-02 LAB — MICROALBUMIN / CREATININE URINE RATIO
Creatinine, Urine: 71.1 mg/dL
Microalb/Creat Ratio: <4 mg/g creat (ref 0–29)
Microalbumin, Urine: <3 ug/mL

## 2020-03-02 LAB — HEMOGLOBIN A1C
Est. average glucose Bld gHb Est-mCnc: 126 mg/dL
Hgb A1c MFr Bld: 6 % — ABNORMAL HIGH (ref 4.8–5.6)

## 2020-04-26 ENCOUNTER — Other Ambulatory Visit: Payer: Self-pay

## 2020-04-26 ENCOUNTER — Telehealth (INDEPENDENT_AMBULATORY_CARE_PROVIDER_SITE_OTHER): Payer: Self-pay | Admitting: Family Medicine

## 2020-04-26 ENCOUNTER — Encounter: Payer: Self-pay | Admitting: Family Medicine

## 2020-04-26 VITALS — Ht 68.0 in | Wt 162.0 lb

## 2020-04-26 DIAGNOSIS — R059 Cough, unspecified: Secondary | ICD-10-CM

## 2020-04-26 DIAGNOSIS — R05 Cough: Secondary | ICD-10-CM

## 2020-04-26 DIAGNOSIS — J029 Acute pharyngitis, unspecified: Secondary | ICD-10-CM

## 2020-04-26 MED ORDER — BENZONATATE 100 MG PO CAPS: 100.0000 mg | ORAL_CAPSULE | Freq: Three times a day (TID) | ORAL | 0 refills | Status: DC | PRN

## 2020-04-26 NOTE — Progress Notes (Signed)
Virtual Visit via Video Note  I connected with Rennis Petty on 04/26/20 at 5:55 PM by a video enabled telemedicine application and verified that I am speaking with the correct person using two identifiers.  Patient location: in car - spouse driving, consent obtained to discuss PHI on speaker phone My location: in office.   I discussed the limitations, risks, security and privacy concerns of performing an evaluation and management service by telephone and the availability of in person appointments. I also discussed with the patient that there may be a patient responsible charge related to this service. The patient expressed understanding and agreed to proceed, consent obtained  Chief complaint: Chief Complaint  Patient presents with  . Sore Throat    started 4-5 days ago. Pt says he has a slight dry cough. Pt has taken so musinex.     History of Present Illness: Lonnie Richardson is a 49 y.o. male  Slight cough, sore throat past 7 days. No white patches on back of throat. Eating and drinking normally. Swallowing normally. Minimal fatigue.  Dry cough only. No fever. Feels well otherwise.  No runny nose,no congestion.  No loss of taste/smell. No dyspnea or chest pain.  No known sick contacts.  Had covid vaccine earlier in the year. No known exposure to covid 19.  Feels a little better past few days.  Cough keeps up at night. Some cough in heat.   Tx: mucinex - min improvement, some fatigue with this medicine.    Patient Active Problem List   Diagnosis Date Noted  . Family history of early CAD 11/09/2014  . Elevated blood sugar 11/09/2014  . Abnormal ECG 11/09/2014  . HTN (hypertension) 01/14/2012   Past Medical History:  Diagnosis Date  . Diabetes mellitus without complication (HCC)   . Hypertension    Past Surgical History:  Procedure Laterality Date  . none     No Known Allergies Prior to Admission medications   Medication Sig Start Date End Date Taking?  Authorizing Provider  atorvastatin (LIPITOR) 10 MG tablet Take 1 tablet (10 mg total) by mouth daily at 6 PM. 03/01/20  Yes Shade Flood, MD  lisinopril (ZESTRIL) 2.5 MG tablet Take 1 tablet (2.5 mg total) by mouth daily. 08/24/19  Yes Shade Flood, MD   Social History   Socioeconomic History  . Marital status: Married    Spouse name: Not on file  . Number of children: 1  . Years of education: Not on file  . Highest education level: Not on file  Occupational History  . Not on file  Tobacco Use  . Smoking status: Never Smoker  . Smokeless tobacco: Never Used  Vaping Use  . Vaping Use: Never used  Substance and Sexual Activity  . Alcohol use: Yes  . Drug use: No  . Sexual activity: Yes  Other Topics Concern  . Not on file  Social History Narrative  . Not on file   Social Determinants of Health   Financial Resource Strain:   . Difficulty of Paying Living Expenses:   Food Insecurity:   . Worried About Programme researcher, broadcasting/film/video in the Last Year:   . Barista in the Last Year:   Transportation Needs:   . Freight forwarder (Medical):   Marland Kitchen Lack of Transportation (Non-Medical):   Physical Activity:   . Days of Exercise per Week:   . Minutes of Exercise per Session:   Stress:   . Feeling of Stress :  Social Connections:   . Frequency of Communication with Friends and Family:   . Frequency of Social Gatherings with Friends and Family:   . Attends Religious Services:   . Active Member of Clubs or Organizations:   . Attends Banker Meetings:   Marland Kitchen Marital Status:   Intimate Partner Violence:   . Fear of Current or Ex-Partner:   . Emotionally Abused:   Marland Kitchen Physically Abused:   . Sexually Abused:     Observations/Objective: Vitals:   04/26/20 1143  Weight: 162 lb (73.5 kg)  Height: 5\' 8"  (1.727 m)     Assessment and Plan: Cough - Plan: benzonatate (TESSALON) 100 MG capsule  Sore throat  -Possible viral illness, afebrile, has been vaccinated  for Covid.  Unlikely cause.  Differential includes ACE inhibitor cough but less likely contributor to sore throat.  Does report some symptomatic improvement recently.  -Tessalon Perles, symptomatic care, fluids, rest, RTC precautions.  If persistent dry cough, would consider temporary hold  ACE inhibitor.  Understanding of plan expressed.  Follow Up Instructions: As needed.    I discussed the assessment and treatment plan with the patient. The patient was provided an opportunity to ask questions and all were answered. The patient agreed with the plan and demonstrated an understanding of the instructions.   The patient was advised to call back or seek an in-person evaluation if the symptoms worsen or if the condition fails to improve as anticipated.  I provided 15 minutes of non-face-to-face time during this encounter.   , MD

## 2020-04-26 NOTE — Patient Instructions (Addendum)
Try tessalon perles for cough, drink plenty of fluids.make sure to get plenty of rest.  If not improving in next 1 week let me know. Cough is less likely your blood pressure medicine, but if cough continues can possibly stop that temporarily. No changes for now.   Return to the clinic or go to the nearest emergency room if any of your symptoms worsen or new symptoms occur.  Cough, Adult Coughing is a reflex that clears your throat and your airways (respiratory system). Coughing helps to heal and protect your lungs. It is normal to cough occasionally, but a cough that happens with other symptoms or lasts a long time may be a sign of a condition that needs treatment. An acute cough may only last 2-3 weeks, while a chronic cough may last 8 or more weeks. Coughing is commonly caused by:  Infection of the respiratory systemby viruses or bacteria.  Breathing in substances that irritate your lungs.  Allergies.  Asthma.  Mucus that runs down the back of your throat (postnasal drip).  Smoking.  Acid backing up from the stomach into the esophagus (gastroesophageal reflux).  Certain medicines.  Chronic lung problems.  Other medical conditions such as heart failure or a blood clot in the lung (pulmonary embolism). Follow these instructions at home: Medicines  Take over-the-counter and prescription medicines only as told by your health care provider.  Talk with your health care provider before you take a cough suppressant medicine. Lifestyle   Avoid cigarette smoke. Do not use any products that contain nicotine or tobacco, such as cigarettes, e-cigarettes, and chewing tobacco. If you need help quitting, ask your health care provider.  Drink enough fluid to keep your urine pale yellow.  Avoid caffeine.  Do not drink alcohol if your health care provider tells you not to drink. General instructions   Pay close attention to changes in your cough. Tell your health care provider about  them.  Always cover your mouth when you cough.  Avoid things that make you cough, such as perfume, candles, cleaning products, or campfire or tobacco smoke.  If the air is dry, use a cool mist vaporizer or humidifier in your bedroom or your home to help loosen secretions.  If your cough is worse at night, try to sleep in a semi-upright position.  Rest as needed.  Keep all follow-up visits as told by your health care provider. This is important. Contact a health care provider if you:  Have new symptoms.  Cough up pus.  Have a cough that does not get better after 2-3 weeks or gets worse.  Cannot control your cough with cough suppressant medicines and you are losing sleep.  Have pain that gets worse or pain that is not helped with medicine.  Have a fever.  Have unexplained weight loss.  Have night sweats. Get help right away if:  You cough up blood.  You have difficulty breathing.  Your heartbeat is very fast. These symptoms may represent a serious problem that is an emergency. Do not wait to see if the symptoms will go away. Get medical help right away. Call your local emergency services (911 in the [place] Do not drive yourself to the hospital. Summary  Coughing is a reflex that clears your throat and your airways. It is normal to cough occasionally, but a cough that happens with other symptoms or lasts a long time may be a sign of a condition that needs treatment.  Take over-the-counter and prescription medicines only as  told by your health care provider.  Always cover your mouth when you cough.  Contact a health care provider if you have new symptoms or a cough that does not get better after 2-3 weeks or gets worse. This information is not intended to replace advice given to you by your health care provider. Make sure you discuss any questions you have with your health care provider. Document Revised: 11/01/2018 Document Reviewed: 11/01/2018 Elsevier Patient Education   2020 Elsevier Inc.   Sore Throat A sore throat is pain, burning, irritation, or scratchiness in the throat. When you have a sore throat, you may feel pain or tenderness in your throat when you swallow or talk. Many things can cause a sore throat, including:  An infection.  Seasonal allergies.  Dryness in the air.  Irritants, such as smoke or pollution.  Radiation treatment to the area.  Gastroesophageal reflux disease (GERD).  A tumor. A sore throat is often the first sign of another sickness. It may happen with other symptoms, such as coughing, sneezing, fever, and swollen neck glands. Most sore throats go away without medical treatment. Follow these instructions at home:      Take over-the-counter medicines only as told by your health care provider. ? If your child has a sore throat, do not give your child aspirin because of the association with Reye syndrome.  Drink enough fluids to keep your urine pale yellow.  Rest as needed.  To help with pain, try: ? Sipping warm liquids, such as broth, herbal tea, or warm water. ? Eating or drinking cold or frozen liquids, such as frozen ice pops. ? Gargling with a salt-water mixture 3-4 times a day or as needed. To make a salt-water mixture, completely dissolve -1 tsp (3-6 g) of salt in 1 cup (237 mL) of warm water. ? Sucking on hard candy or throat lozenges. ? Putting a cool-mist humidifier in your bedroom at night to moisten the air. ? Sitting in the bathroom with the door closed for 5-10 minutes while you run hot water in the shower.  Do not use any products that contain nicotine or tobacco, such as cigarettes, e-cigarettes, and chewing tobacco. If you need help quitting, ask your health care provider.  Wash your hands well and often with soap and water. If soap and water are not available, use hand sanitizer. Contact a health care provider if:  You have a fever for more than 2-3 days.  You have symptoms that last (are  persistent) for more than 2-3 days.  Your throat does not get better within 7 days.  You have a fever and your symptoms suddenly get worse.  Your child who is 3 months to 56 years old has a temperature of 102.30F (39C) or higher. Get help right away if:  You have difficulty breathing.  You cannot swallow fluids, soft foods, or your saliva.  You have increased swelling in your throat or neck.  You have persistent nausea and vomiting. Summary  A sore throat is pain, burning, irritation, or scratchiness in the throat. Many things can cause a sore throat.  Take over-the-counter medicines only as told by your health care provider. Do not give your child aspirin.  Drink plenty of fluids, and rest as needed.  Contact a health care provider if your symptoms worsen or your sore throat does not get better within 7 days. This information is not intended to replace advice given to you by your health care provider. Make sure you discuss  any questions you have with your health care provider. Document Revised: 03/15/2018 Document Reviewed: 03/15/2018 Elsevier Patient Education  [employer].     If you have lab work done today you will be contacted with your lab results within the next 2 weeks.  If you have not heard from [place] then please contact us. The fastest way to get your results is to register for My Chart.   IF you received an x-ray today, you will receive an invoice from [facility]. Please contact [facility] at [phone] with questions or concerns regarding your invoice.   IF you received labwork today, you will receive an invoice from [place]. Please contact [facility] at [phone] with questions or concerns regarding your invoice.   Our billing staff will not be able to assist you with questions regarding bills from these companies.  You will be contacted with the lab results as soon as they are available. The fastest way to get your results is to  activate your My Chart account. Instructions are located on the last page of this paperwork. If you have not heard from [place] regarding the results in 2 weeks, please contact this office.
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
## 2020-09-05 ENCOUNTER — Encounter: Payer: Self-pay | Admitting: Family Medicine

## 2020-09-05 ENCOUNTER — Other Ambulatory Visit: Payer: Self-pay

## 2020-09-05 ENCOUNTER — Ambulatory Visit (INDEPENDENT_AMBULATORY_CARE_PROVIDER_SITE_OTHER): Payer: Self-pay | Admitting: Family Medicine

## 2020-09-05 VITALS — BP 129/87 | HR 63 | Temp 98.0°F | Ht 68.0 in | Wt 166.0 lb

## 2020-09-05 DIAGNOSIS — Z23 Encounter for immunization: Secondary | ICD-10-CM

## 2020-09-05 DIAGNOSIS — E119 Type 2 diabetes mellitus without complications: Secondary | ICD-10-CM

## 2020-09-05 DIAGNOSIS — E785 Hyperlipidemia, unspecified: Secondary | ICD-10-CM

## 2020-09-05 DIAGNOSIS — I10 Essential (primary) hypertension: Secondary | ICD-10-CM

## 2020-09-05 LAB — COMPREHENSIVE METABOLIC PANEL
ALT: 21 IU/L (ref 0–44)
AST: 13 IU/L (ref 0–40)
Albumin/Globulin Ratio: 1.6 (ref 1.2–2.2)
Albumin: 4.6 g/dL (ref 4.0–5.0)
Alkaline Phosphatase: 47 IU/L (ref 44–121)
BUN/Creatinine Ratio: 12 (ref 9–20)
BUN: 12 mg/dL (ref 6–24)
Bilirubin Total: 0.6 mg/dL (ref 0.0–1.2)
CO2: 24 mmol/L (ref 20–29)
Calcium: 9.9 mg/dL (ref 8.7–10.2)
Chloride: 101 mmol/L (ref 96–106)
Creatinine, Ser: 1.03 mg/dL (ref 0.76–1.27)
GFR calc Af Amer: 100 mL/min/{1.73_m2} (ref >59)
GFR calc non Af Amer: 86 mL/min/{1.73_m2} (ref >59)
Globulin, Total: 2.8 g/dL (ref 1.5–4.5)
Glucose: 100 mg/dL — ABNORMAL HIGH (ref 65–99)
Potassium: 4.7 mmol/L (ref 3.5–5.2)
Sodium: 139 mmol/L (ref 134–144)
Total Protein: 7.4 g/dL (ref 6.0–8.5)

## 2020-09-05 LAB — LIPID PANEL
Chol/HDL Ratio: 4.3 ratio (ref 0.0–5.0)
Cholesterol, Total: 200 mg/dL — ABNORMAL HIGH (ref 100–199)
HDL: 47 mg/dL (ref >39)
LDL Chol Calc (NIH): 135 mg/dL — ABNORMAL HIGH (ref 0–99)
Triglycerides: 98 mg/dL (ref 0–149)
VLDL Cholesterol Cal: 18 mg/dL (ref 5–40)

## 2020-09-05 LAB — HEMOGLOBIN A1C
Est. average glucose Bld gHb Est-mCnc: 143 mg/dL
Hgb A1c MFr Bld: 6.6 % — ABNORMAL HIGH (ref 4.8–5.6)

## 2020-09-05 MED ORDER — ATORVASTATIN CALCIUM 10 MG PO TABS: 10.0000 mg | ORAL_TABLET | Freq: Every day | ORAL | 2 refills | Status: DC

## 2020-09-05 MED ORDER — LISINOPRIL 2.5 MG PO TABS: 2.5000 mg | ORAL_TABLET | Freq: Every day | ORAL | 2 refills | Status: DC

## 2020-09-05 NOTE — Patient Instructions (Addendum)
I do recommend starting lipitor once per day, but will check labs again today.  Keep a record of your blood pressures outside of the office and take lisinopril daily if over 130/80.   Type 2 Diabetes Mellitus, Self Care, Adult Caring for yourself after you have been diagnosed with type 2 diabetes (type 2 diabetes mellitus) means keeping your blood sugar (glucose) under control with a balance of:  Nutrition.  Exercise.  Lifestyle changes.  Medicines or insulin, if necessary.  Support from your team of health care providers and others. The following information explains what you need to know to manage your diabetes at home. What are the risks? Having diabetes can put you at risk for other long-term (chronic) conditions, such as heart disease and kidney disease. Your health care provider may prescribe medicines to help prevent complications from diabetes. These medicines may include:  Aspirin.  Medicine to lower cholesterol.  Medicine to control blood pressure. How to monitor blood glucose   Check your blood glucose every day, as often as told by your health care provider.  Have your A1c (hemoglobin A1c) level checked two or more times a year, or as often as told by your health care provider. Your health care provider will set individualized treatment goals for you. Generally, the goal of treatment is to maintain the following blood glucose levels:  Before meals (preprandial): 80-130 mg/dL (4.4-7.2 mmol/L).  After meals (postprandial): below 180 mg/dL (10 mmol/L).  A1c level: less than 7%. How to manage hyperglycemia and hypoglycemia Hyperglycemia symptoms Hyperglycemia, also called high blood glucose, occurs when blood glucose is too high. Make sure you know the early signs of hyperglycemia, such as:  Increased thirst.  Hunger.  Feeling very tired.  Needing to urinate more often than usual.  Blurry vision. Hypoglycemia symptoms Hypoglycemia, also called low blood  glucose, occurswith a blood glucose level at or below 70 mg/dL (3.9 mmol/L). The risk for hypoglycemia increases during or after exercise, during sleep, during illness, and when skipping meals or not eating for a long time (fasting). It is important to know the symptoms of hypoglycemia and treat it right away. Always have a 15-gram rapid-acting carbohydrate snack with you to treat low blood glucose. Family members and close friends should also know the symptoms and should understand how to treat hypoglycemia, in case you are not able to treat yourself. Symptoms may include:  Hunger.  Anxiety.  Sweating and feeling clammy.  Confusion.  Dizziness or feeling light-headed.  Sleepiness.  Nausea.  Increased heart rate.  Headache.  Blurry vision.  Irritability.  A change in coordination.  Tingling or numbness around the mouth, lips, or tongue.  Restless sleep.  Fainting.  Seizure. Treating hypoglycemia If you are alert and able to swallow safely, follow the 15:15 rule:  Take 15 grams of a rapid-acting carbohydrate. Talk with your health care provider about how much you should take.  Rapid-acting options include: ? Glucose pills (take 15 grams). ? 6-8 pieces of hard candy. ? 4-6 oz (120-150 mL) of fruit juice. ? 4-6 oz (120-150 mL) of regular (not diet) soda. ? 1 Tbsp (15 mL) honey or sugar.  Check your blood glucose 15 minutes after you take the carbohydrate.  If the repeat blood glucose level is still at or below 70 mg/dL (3.9 mmol/L), take 15 grams of a carbohydrate again.  If your blood glucose level does not increase above 70 mg/dL (3.9 mmol/L) after 3 tries, seek emergency medical care.  After your blood  glucose level returns to normal, eat a meal or a snack within 1 hour. Treating severe hypoglycemia Severe hypoglycemia is when your blood glucose level is at or below 54 mg/dL (3 mmol/L). Severe hypoglycemia is an emergency. Do not wait to see if the symptoms will  go away. Get medical help right away. Call your local emergency services (911 in the [place] If you have severe hypoglycemia and you cannot eat or drink, you may need an injection of glucagon. A family member or close friend should learn how to check your blood glucose and how to give you a glucagon injection. Ask your health care provider if you need to have an emergency glucagon injection kit available. Severe hypoglycemia may need to be treated in a hospital. The treatment may include getting glucose through an IV. You may also need treatment for the cause of your hypoglycemia. Follow these instructions at home: Take diabetes medicines as told  If your health care provider prescribed insulin or diabetes medicines, take them every day.  Do not run out of insulin or other diabetes medicines that you take. Plan ahead so you always have these available.  If you use insulin, adjust your dosage based on how physically active you are and what foods you eat. Your health care provider will tell you how to adjust your dosage. Make healthy food choices  The things that you eat and drink affect your blood glucose and your insulin dosage. Making good choices helps to control your diabetes and prevent other health problems. A healthy meal plan includes eating lean proteins, complex carbohydrates, fresh fruits and vegetables, low-fat dairy products, and healthy fats. Make an appointment to see a diet and nutrition specialist (registered dietitian) to help you create an eating plan that is right for you. Make sure that you:  Follow instructions from your health care provider about eating or drinking restrictions.  Drink enough fluid to keep your urine pale yellow.  Keep a record of the carbohydrates that you eat. Do this by reading food labels and learning the standard serving sizes of foods.  Follow your sick day plan whenever you cannot eat or drink as usual. Make this plan in advance with your health care  provider.  Stay active Exercise regularly, as told by your health care provider. This may include:  Stretching and doing strength exercises, such as yoga or weightlifting, 2 or more times a week.  Doing 150 minutes or more of moderate-intensity or vigorous-intensity exercise each week. This could be brisk walking, biking, or water aerobics. ? Spread out your activity over 3 or more days of the week. ? Do not go more than 2 days in a row without doing some kind of physical activity. When you start a new exercise or activity, work with your health care provider to adjust your insulin, medicines, or food intake as needed. Make healthy lifestyle choices  Do not use any tobacco products, such as cigarettes, chewing tobacco, and e-cigarettes. If you need help quitting, ask your health care provider.  If your health care provider says that alcohol is safe for you, limit alcohol intake to no more than 1 drink per day for nonpregnant women and 2 drinks per day for men. One drink equals 12 oz of beer (355 mL), 5 oz of wine (148 mL), or 1 oz of hard liquor (44 mL).  Learn to manage stress. If you need help with this, ask your health care provider. Care for your body   Keep your  immunizations up to date. In addition to getting vaccinations as told by your health care provider, it is recommended that you get vaccinated against the following illnesses: ? The flu (influenza). Get a flu shot every year. ? Pneumonia. ? Hepatitis B.  Schedule an eye exam soon after your diagnosis, and then one time every year after that.  Check your skin and feet every day for cuts, bruises, redness, blisters, or sores. Schedule a foot exam with your health care provider once every year.  Brush your teeth and gums two times a day, and floss one or more times a day. Visit your dentist one or more times every 6 months.  Maintain a healthy weight. General instructions  Take over-the-counter and prescription medicines  only as told by your health care provider.  Share your diabetes management plan with people in your workplace, school, and household.  Carry a medical alert card or wear medical alert jewelry.  Keep all follow-up visits as told by your health care provider. This is important. Questions to ask your health care provider  Do I need to meet with a diabetes educator?  Where can I find a support group for people with diabetes? Where to find more information For more information about diabetes, visit:  American Diabetes Association (ADA): www.diabetes.org  American Association of Diabetes Educators (AADE): www.diabeteseducator.org Summary  Caring for yourself after you have been diagnosed with (type 2 diabetes mellitus) means keeping your blood sugar (glucose) under control with a balance of nutrition, exercise, lifestyle changes, and medicine.  Check your blood glucose every day, as often as told by your health care provider.  Having diabetes can put you at risk for other long-term (chronic) conditions, such as heart disease and kidney disease. Your health care provider may prescribe medicines to help prevent complications from diabetes.  Keep all follow-up visits as told by your health care provider. This is important. This information is not intended to replace advice given to you by your health care provider. Make sure you discuss any questions you have with your health care provider. Document Revised: 04/05/2018 Document Reviewed: 11/16/2015 Elsevier Patient Education  [employer].    If you have lab work done today you will be contacted with your lab results within the next 2 weeks.  If you have not heard from [place] then please contact us. The fastest way to get your results is to register for My Chart.   IF you received an x-ray today, you will receive an invoice from [facility]. Please contact [facility] at [phone] with questions or concerns regarding  your invoice.   IF you received labwork today, you will receive an invoice from [place]. Please contact [facility] at [phone] with questions or concerns regarding your invoice.   Our billing staff will not be able to assist you with questions regarding bills from these companies.  You will be contacted with the lab results as soon as they are available. The fastest way to get your results is to activate your My Chart account. Instructions are located on the last page of this paperwork. If you have not heard from [place] regarding the results in 2 weeks, please contact this office.

## 2020-09-05 NOTE — Progress Notes (Signed)
Subjective:  Patient ID: Lonnie Richardson, male    DOB: 03/29/1971  Age: 49 y.o. MRN: [id number]  CC:  Chief Complaint  Patient presents with  . Follow-up    on diabetes and hyperlipidemia. PT reports no issues with these conditions since last OV. PT reports watching his diet and getting exercise.     HPI Lonnie Richardson presents for    Diabetes: With HLD.  Diet controlled. Exercising daily.  Wt Readings from Last 3 Encounters:  09/05/20 166 lb (75.3 kg)  04/26/20 162 lb (73.5 kg)  03/01/20 160 lb (72.6 kg)   Microalbumin: Normal ratio May 6. Optho, foot exam, pneumovax: no retinopathy on 2/21 optho visit.   Lab Results  Component Value Date   HGBA1C 6.0 (H) 03/01/2020   HGBA1C 5.9 (H) 08/24/2019   HGBA1C 7.3 (H) 10/05/2018   Hypertension: Treated with low-dose lisinopril 2.5 mg 3 to 4 days/week at his last visit. Taking lisinopril few days per week, but not checking home readings: BP Readings from Last 3 Encounters:  09/05/20 129/87  03/01/20 138/90  08/24/19 123/81   Lab Results  Component Value Date   CREATININE 0.87 03/01/2020   Hyperlipidemia: Statin recommended previously, was taking ginger and garlic only at his May 6 visit.  Did recommend restarting Lipitor at that time, especially with history of diabetes. Has not taken lipitor.  Risk of DM and HLD discussed. He would like to see numbers.  Lab Results  Component Value Date   CHOL 202 (H) 03/01/2020   HDL 48 03/01/2020   LDLCALC 137 (H) 03/01/2020   TRIG 92 03/01/2020   CHOLHDL 4.2 03/01/2020   Lab Results  Component Value Date   ALT 22 03/01/2020   AST 17 03/01/2020   ALKPHOS 51 03/01/2020   BILITOT 0.6 03/01/2020    Immunization History  Administered Date(s) Administered  . Influenza,inj,Quad PF,6+ Mos 08/24/2019  . Pneumococcal Polysaccharide-23 07/17/2018  . Tdap 10/09/2014  flu vaccine today.  covid vaccine: 1/26, 2/25 - Pfizer.   History Patient Active Problem List   Diagnosis  Date Noted  . Family history of early CAD 11/09/2014  . Elevated blood sugar 11/09/2014  . Abnormal ECG 11/09/2014  . HTN (hypertension) 01/14/2012   Past Medical History:  Diagnosis Date  . Diabetes mellitus without complication (HCC)   . Hypertension    Past Surgical History:  Procedure Laterality Date  . none     No Known Allergies Prior to Admission medications   Medication Sig Start Date End Date Taking? Authorizing Provider  lisinopril (ZESTRIL) 2.5 MG tablet Take 1 tablet (2.5 mg total) by mouth daily. 08/24/19  Yes Shade Flood, MD  atorvastatin (LIPITOR) 10 MG tablet Take 1 tablet (10 mg total) by mouth daily at 6 PM. Patient not taking: Reported on 09/05/2020 03/01/20   Shade Flood, MD  benzonatate (TESSALON) 100 MG capsule Take 1 capsule (100 mg total) by mouth 3 (three) times daily as needed for cough. Patient not taking: Reported on 09/05/2020 04/26/20   Shade Flood, MD   Social History   Socioeconomic History  . Marital status: Married    Spouse name: Not on file  . Number of children: 1  . Years of education: Not on file  . Highest education level: Not on file  Occupational History  . Not on file  Tobacco Use  . Smoking status: Never Smoker  . Smokeless tobacco: Never Used  Vaping Use  . Vaping Use: Never used  Substance  and Sexual Activity  . Alcohol use: Yes  . Drug use: No  . Sexual activity: Yes  Other Topics Concern  . Not on file  Social History Narrative  . Not on file   Social Determinants of Health   Financial Resource Strain:   . Difficulty of Paying Living Expenses: Not on file  Food Insecurity:   . Worried About Programme researcher, broadcasting/film/video in the Last Year: Not on file  . Ran Out of Food in the Last Year: Not on file  Transportation Needs:   . Lack of Transportation (Medical): Not on file  . Lack of Transportation (Non-Medical): Not on file  Physical Activity:   . Days of Exercise per Week: Not on file  . Minutes of Exercise  per Session: Not on file  Stress:   . Feeling of Stress : Not on file  Social Connections:   . Frequency of Communication with Friends and Family: Not on file  . Frequency of Social Gatherings with Friends and Family: Not on file  . Attends Religious Services: Not on file  . Active Member of Clubs or Organizations: Not on file  . Attends Banker Meetings: Not on file  . Marital Status: Not on file  Intimate Partner Violence:   . Fear of Current or Ex-Partner: Not on file  . Emotionally Abused: Not on file  . Physically Abused: Not on file  . Sexually Abused: Not on file    Review of Systems  Constitutional: Negative for fatigue and unexpected weight change.  Eyes: Negative for visual disturbance.  Respiratory: Negative for cough, chest tightness and shortness of breath.   Cardiovascular: Negative for chest pain, palpitations and leg swelling.  Gastrointestinal: Negative for abdominal pain and blood in stool.  Neurological: Negative for dizziness, light-headedness and headaches.     Objective:   Vitals:   09/05/20 0847  BP: 129/87  Pulse: 63  Temp: 98 F (36.7 C)  TempSrc: Temporal  SpO2: 99%  Weight: 166 lb (75.3 kg)  Height: 5\' 8"  (1.727 m)     Physical Exam Vitals reviewed.  Constitutional:      Appearance: He is well-developed.  HENT:     Head: Normocephalic and atraumatic.  Eyes:     Pupils: Pupils are equal, round, and reactive to light.  Neck:     Vascular: No carotid bruit or JVD.  Cardiovascular:     Rate and Rhythm: Normal rate and regular rhythm.     Heart sounds: Normal heart sounds. No murmur heard.   Pulmonary:     Effort: Pulmonary effort is normal.     Breath sounds: Normal breath sounds. No rales.  Skin:    General: Skin is warm and dry.  Neurological:     Mental Status: He is alert and oriented to person, place, and time.        Assessment & Plan:  Lonnie Richardson is a 49 y.o. male . Diabetes mellitus type 2,  diet-controlled (HCC) - Plan: Hemoglobin A1c  -Diet controlled, handout given on self-care as well as recommended range for home readings.  Intermittent testing recommended.  Continue exercise, diet control  Essential hypertension - Plan: Lipid panel, Comprehensive metabolic panel, lisinopril (ZESTRIL) 2.5 MG tablet  -Borderline, daily Lisinopril dosing discussed if 130/80 or higher.  Hyperlipidemia, unspecified hyperlipidemia type - Plan: Lipid panel, Comprehensive metabolic panel  -Discussed the benefit of statin with diabetes vaccine booster based on previous lipids.  Reordered Lipitor, but will recheck labs.  Flu vaccine given, plan for covid vaccine booster.   No orders of the defined types were placed in this encounter.  Patient Instructions       If you have lab work done today you will be contacted with your lab results within the next 2 weeks.  If you have not heard from [place] then please contact us. The fastest way to get your results is to register for My Chart.   IF you received an x-ray today, you will receive an invoice from [facility]. Please contact [facility] at [phone] with questions or concerns regarding your invoice.   IF you received labwork today, you will receive an invoice from [place]. Please contact [facility] at [phone] with questions or concerns regarding your invoice.   Our billing staff will not be able to assist you with questions regarding bills from these companies.  You will be contacted with the lab results as soon as they are available. The fastest way to get your results is to activate your My Chart account. Instructions are located on the last page of this paperwork. If you have not heard from [place] regarding the results in 2 weeks, please contact this office.         Signed, [name], MD [facility]

## 2021-03-06 ENCOUNTER — Ambulatory Visit: Payer: Self-pay | Admitting: Family Medicine

## 2021-03-06 ENCOUNTER — Ambulatory Visit (INDEPENDENT_AMBULATORY_CARE_PROVIDER_SITE_OTHER): Payer: Self-pay | Admitting: Family Medicine

## 2021-03-06 ENCOUNTER — Other Ambulatory Visit: Payer: Self-pay

## 2021-03-06 ENCOUNTER — Encounter: Payer: Self-pay | Admitting: Family Medicine

## 2021-03-06 VITALS — BP 138/82 | HR 63 | Temp 98.3°F | Resp 16 | Ht 68.0 in | Wt 170.2 lb

## 2021-03-06 DIAGNOSIS — E785 Hyperlipidemia, unspecified: Secondary | ICD-10-CM

## 2021-03-06 DIAGNOSIS — E119 Type 2 diabetes mellitus without complications: Secondary | ICD-10-CM

## 2021-03-06 DIAGNOSIS — I10 Essential (primary) hypertension: Secondary | ICD-10-CM

## 2021-03-06 LAB — COMPREHENSIVE METABOLIC PANEL
ALT: 17 U/L (ref 0–53)
AST: 15 U/L (ref 0–37)
Albumin: 4.6 g/dL (ref 3.5–5.2)
Alkaline Phosphatase: 49 U/L (ref 39–117)
BUN: 14 mg/dL (ref 6–23)
CO2: 30 mEq/L (ref 19–32)
Calcium: 10 mg/dL (ref 8.4–10.5)
Chloride: 101 mEq/L (ref 96–112)
Creatinine, Ser: 0.97 mg/dL (ref 0.40–1.50)
GFR: 92.8 mL/min (ref >60.00)
Glucose, Bld: 117 mg/dL — ABNORMAL HIGH (ref 70–99)
Potassium: 4.6 mEq/L (ref 3.5–5.1)
Sodium: 138 mEq/L (ref 135–145)
Total Bilirubin: 0.5 mg/dL (ref 0.2–1.2)
Total Protein: 7.4 g/dL (ref 6.0–8.3)

## 2021-03-06 LAB — LIPID PANEL
Cholesterol: 202 mg/dL — ABNORMAL HIGH (ref 0–200)
HDL: 55 mg/dL (ref >39.00)
LDL Cholesterol: 131 mg/dL — ABNORMAL HIGH (ref 0–99)
NonHDL: 147.03
Total CHOL/HDL Ratio: 4
Triglycerides: 81 mg/dL (ref 0.0–149.0)
VLDL: 16.2 mg/dL (ref 0.0–40.0)

## 2021-03-06 LAB — MICROALBUMIN / CREATININE URINE RATIO
Creatinine,U: 85.7 mg/dL
Microalb Creat Ratio: 0.8 mg/g (ref 0.0–30.0)
Microalb, Ur: <0.7 mg/dL (ref 0.0–1.9)

## 2021-03-06 LAB — HEMOGLOBIN A1C: Hgb A1c MFr Bld: 6.8 % — ABNORMAL HIGH (ref 4.6–6.5)

## 2021-03-06 MED ORDER — LOSARTAN POTASSIUM 25 MG PO TABS: 25.0000 mg | ORAL_TABLET | Freq: Every day | ORAL | 2 refills | Status: DC

## 2021-03-06 NOTE — Patient Instructions (Addendum)
Stop lisinopril to see if cause of cough.  Start losartan 25mg  per day. If new side effects let me know. If no change in cough, then not caused by lisinopril. Follow up if cough not improved  I do recommend taking Lipitor evan a few days per week if still at diabetes level on blood sugar.   Call for eye exam: [facility] Address: [address], [place], [place] Phone: [phone]

## 2021-03-06 NOTE — Progress Notes (Signed)
Subjective:  Patient ID: Lonnie Richardson, male    DOB: [birth year]  Age: 50 y.o. MRN: [id number]  CC:  Chief Complaint  Patient presents with  . Diabetes    Pt doing well would like lab work done today, pt notes he is fasting   . Hyperlipidemia    Pt doing well has tried to maintain diet. Notes he did not start atorvastatin tried control with diet   . Hypertension    Pt has question about lisinopril as he notes he gets a cough on occasion and wondered as this can be a side effect of the medication     HPI Tomy Khim presents for   Diabetes: Improved in past with diet/exercise - increased in November. No current meds. Microalbumin: normal ratio 03/01/20  Optho, foot exam, pneumovax:  Due for diabetic exam. Plans to schedule - [facility] care.  Weight up 3#.  Avoiding bread, min rice, intermittent fasting. 1 hour exercise daily. No increased thirst, urinary frequency or blurry vision.  .  Lab Results  Component Value Date   HGBA1C 6.6 (H) 09/05/2020   HGBA1C 6.0 (H) 03/01/2020   HGBA1C 5.9 (H) 08/24/2019   Lab Results  Component Value Date   MICROALBUR 1.7 09/04/2016   LDLCALC 135 (H) 09/05/2020   CREATININE 1.03 09/05/2020   Wt Readings from Last 3 Encounters:  03/06/21 170 lb 3.2 oz (77.2 kg)  09/05/20 166 lb (75.3 kg)  04/26/20 162 lb (73.5 kg)    Hyperlipidemia: Lipitor 10mg  qd recommended prior, discussed stain benefit with diabetes as well.  The 10-year ASCVD risk score DC Denman George., et al., 2013) is: 6.9%   Values used to calculate the score:     Age: 60 years     Sex: Male     Is Non-Hispanic African American: No     Diabetic: Yes     Tobacco smoker: No     Systolic Blood Pressure: 138 mmHg     Is BP treated: Yes     HDL Cholesterol: 47 mg/dL     Total Cholesterol: 200 mg/dL Did not take statin. Trying to control with diet.   Lab Results  Component Value Date   CHOL 200 (H) 09/05/2020   HDL 47 09/05/2020   LDLCALC 135 (H) 09/05/2020   TRIG  98 09/05/2020   CHOLHDL 4.3 09/05/2020   Lab Results  Component Value Date   ALT 21 09/05/2020   AST 13 09/05/2020   ALKPHOS 47 09/05/2020   BILITOT 0.6 09/05/2020    Hypertension: Lisinopril 2.5mg  qd.  Rare cough- dry cough. Notices improvement with avoiding milk. Rare dry throat feeling. No persistent cough. Unsure if worse with allergy. Would like to try different med.  Home readings:130/82.  BP Readings from Last 3 Encounters:  03/06/21 138/82  09/05/20 129/87  03/01/20 138/90   Lab Results  Component Value Date   CREATININE 1.03 09/05/2020      History Patient Active Problem List   Diagnosis Date Noted  . Family history of early CAD 11/09/2014  . Elevated blood sugar 11/09/2014  . Abnormal ECG 11/09/2014  . HTN (hypertension) 01/14/2012   Past Medical History:  Diagnosis Date  . Diabetes mellitus without complication (HCC)   . Hypertension    Past Surgical History:  Procedure Laterality Date  . none     No Known Allergies Prior to Admission medications   Medication Sig Start Date End Date Taking? Authorizing Provider  lisinopril (ZESTRIL) 2.5 MG tablet Take 1  tablet (2.5 mg total) by mouth daily. 09/05/20  Yes Shade Flood, MD  atorvastatin (LIPITOR) 10 MG tablet Take 1 tablet (10 mg total) by mouth daily at 6 PM. Patient not taking: Reported on 03/06/2021 09/05/20   Shade Flood, MD   Social History   Socioeconomic History  . Marital status: Married    Spouse name: Not on file  . Number of children: 1  . Years of education: Not on file  . Highest education level: Not on file  Occupational History  . Not on file  Tobacco Use  . Smoking status: Never Smoker  . Smokeless tobacco: Never Used  Vaping Use  . Vaping Use: Never used  Substance and Sexual Activity  . Alcohol use: Yes  . Drug use: No  . Sexual activity: Yes  Other Topics Concern  . Not on file  Social History Narrative  . Not on file   Social Determinants of Health    Financial Resource Strain: Not on file  Food Insecurity: Not on file  Transportation Needs: Not on file  Physical Activity: Not on file  Stress: Not on file  Social Connections: Not on file  Intimate Partner Violence: Not on file    Review of Systems  Constitutional: Negative for fatigue and unexpected weight change.  Eyes: Negative for visual disturbance.  Respiratory: Positive for cough. Negative for chest tightness and shortness of breath.   Cardiovascular: Negative for chest pain, palpitations and leg swelling.  Gastrointestinal: Negative for abdominal pain and blood in stool.  Neurological: Negative for dizziness, light-headedness and headaches.     Objective:   Vitals:   03/06/21 1045  BP: 138/82  Pulse: 63  Resp: 16  Temp: 98.3 F (36.8 C)  TempSrc: Temporal  SpO2: 96%  Weight: 170 lb 3.2 oz (77.2 kg)  Height: 5\' 8"  (1.727 m)     Physical Exam Vitals reviewed.  Constitutional:      Appearance: He is well-developed.  HENT:     Head: Normocephalic and atraumatic.  Eyes:     Pupils: Pupils are equal, round, and reactive to light.  Neck:     Vascular: No carotid bruit or JVD.  Cardiovascular:     Rate and Rhythm: Normal rate and regular rhythm.     Heart sounds: Normal heart sounds. No murmur heard.   Pulmonary:     Effort: Pulmonary effort is normal.     Breath sounds: Normal breath sounds. No rales.  Skin:    General: Skin is warm and dry.  Neurological:     Mental Status: He is alert and oriented to person, place, and time.     Assessment & Plan:  Lonnie Richardson is a 50 y.o. male . Diabetes mellitus type 2, diet-controlled (HCC) - Plan: Comprehensive metabolic panel, Hemoglobin A1c, Microalbumin / creatinine urine ratio  - check A1c, microalbumin ratio. Likely continued diet/exercise approach.   -Phone number provided for diabetic retinopathy screening.  Essential hypertension - Plan: Comprehensive metabolic panel, losartan (COZAAR) 25 MG  tablet  -  Change to losartan in place of lisinopril for possible cough. rtc precautions if cough persists  Hyperlipidemia, unspecified hyperlipidemia type - Plan: Lipid panel  -Benefit of statin, even intermittent dosing discussed with diabetes, repeat labs.  Meds ordered this encounter  Medications  . losartan (COZAAR) 25 MG tablet    Sig: Take 1 tablet (25 mg total) by mouth daily.    Dispense:  90 tablet    Refill:  2  Patient Instructions  Stop lisinopril to see if cause of cough.  Start losartan 25mg  per day. If new side effects let me know. If no change in cough, then not caused by lisinopril. Follow up if cough not improved  I do recommend taking Lipitor evan a few days per week if still at diabetes level on blood sugar.   Call for eye exam: [facility] Address: [address], [place], [place] Phone: [phone]     Signed, [phone], MD [facility]

## 2021-09-05 ENCOUNTER — Ambulatory Visit (INDEPENDENT_AMBULATORY_CARE_PROVIDER_SITE_OTHER): Payer: Self-pay | Admitting: Family Medicine

## 2021-09-05 VITALS — BP 134/80 | HR 61 | Temp 98.1°F | Resp 15 | Ht 68.0 in | Wt 164.6 lb

## 2021-09-05 DIAGNOSIS — E785 Hyperlipidemia, unspecified: Secondary | ICD-10-CM

## 2021-09-05 DIAGNOSIS — I10 Essential (primary) hypertension: Secondary | ICD-10-CM

## 2021-09-05 DIAGNOSIS — Z23 Encounter for immunization: Secondary | ICD-10-CM

## 2021-09-05 DIAGNOSIS — E1169 Type 2 diabetes mellitus with other specified complication: Secondary | ICD-10-CM

## 2021-09-05 LAB — LIPID PANEL
Cholesterol: 200 mg/dL (ref 0–200)
HDL: 48.3 mg/dL (ref >39.00)
LDL Cholesterol: 135 mg/dL — ABNORMAL HIGH (ref 0–99)
NonHDL: 151.81
Total CHOL/HDL Ratio: 4
Triglycerides: 86 mg/dL (ref 0.0–149.0)
VLDL: 17.2 mg/dL (ref 0.0–40.0)

## 2021-09-05 LAB — COMPREHENSIVE METABOLIC PANEL
ALT: 19 U/L (ref 0–53)
AST: 17 U/L (ref 0–37)
Albumin: 4.6 g/dL (ref 3.5–5.2)
Alkaline Phosphatase: 43 U/L (ref 39–117)
BUN: 10 mg/dL (ref 6–23)
CO2: 29 mEq/L (ref 19–32)
Calcium: 9.6 mg/dL (ref 8.4–10.5)
Chloride: 101 mEq/L (ref 96–112)
Creatinine, Ser: 1.03 mg/dL (ref 0.40–1.50)
GFR: 86.05 mL/min (ref >60.00)
Glucose, Bld: 106 mg/dL — ABNORMAL HIGH (ref 70–99)
Potassium: 5.1 mEq/L (ref 3.5–5.1)
Sodium: 137 mEq/L (ref 135–145)
Total Bilirubin: 0.8 mg/dL (ref 0.2–1.2)
Total Protein: 7.3 g/dL (ref 6.0–8.3)

## 2021-09-05 LAB — HEMOGLOBIN A1C: Hgb A1c MFr Bld: 7 % — ABNORMAL HIGH (ref 4.6–6.5)

## 2021-09-05 MED ORDER — LOSARTAN POTASSIUM 25 MG PO TABS: 25.0000 mg | ORAL_TABLET | Freq: Every day | ORAL | 2 refills | Status: DC

## 2021-09-05 NOTE — Patient Instructions (Addendum)
[facility] is now offering the bivalent Covid booster vaccine. To schedule an appointment or to find more information, visit [email or web address]. You can also call [phone], Monday through Friday, 7 a.m. to 7 p.m. Flu vaccine today.  Congrats on the weight loss! I will let you know the results of your labs. Keep up the good work.  I would recommend starting the atorvastatin once per day for cholesterol and having diabetes. I will recheck levels today.  If blood pressure remains over 130/80 at home, take 2 of the losartan daily and let me know. I can send in the 50mg  dose.  Thanks for coming in today and have a great trip next month.

## 2021-09-05 NOTE — Progress Notes (Signed)
Subjective:  Patient ID: Lonnie Richardson, male    DOB: 12-02-1970  Age: 49 y.o. MRN: [id number]  CC:  Chief Complaint  Patient presents with   Hypertension    Pt denies physical sxs, pt reports 141/91 most recent reading outside of the office.    Diabetes    Pt reports doing okay no hypoglycemic episodes or sxs due for recheck    Hyperlipidemia    Due for recheck     HPI Lonnie Richardson presents for   Hypertension: Losartan 25 mg daily Home readings: 130/90 at times.  BP Readings from Last 3 Encounters:  09/05/21 134/80  03/06/21 138/82  09/05/20 129/87   Lab Results  Component Value Date   CREATININE 0.97 03/06/2021   Hyperlipidemia: Lipitor 10mg  qd prescribed. Did not take - wanted to try diet approach first.  Lab Results  Component Value Date   CHOL 202 (H) 03/06/2021   HDL 55.00 03/06/2021   LDLCALC 131 (H) 03/06/2021   TRIG 81.0 03/06/2021   CHOLHDL 4 03/06/2021   Lab Results  Component Value Date   ALT 17 03/06/2021   AST 15 03/06/2021   ALKPHOS 49 03/06/2021   BILITOT 0.5 03/06/2021   Diabetes: Diet.  He was working on intermittent fasting, 1 hour exercise daily, avoiding bread and minimal rice.  Controlled.  A1c had increased to 6.8 in May.  Planned on diabetic eye exam at that time.  Weight down 6 pounds. Has made some diet changes, intermittent fasting.   Microalbumin: nl ratio in May.  Optho, foot exam, pneumovax: due for optho - has not seen. Plans next year.  Covid vaccine x3, recommended bivalent booster.  Flu vaccine: today.   Immunization History  Administered Date(s) Administered   Influenza,inj,Quad PF,6+ Mos 08/24/2019, 09/05/2020   PFIZER(Purple Top)SARS-COV-2 Vaccination 11/22/2019, 12/22/2019, 11/05/2020   Pneumococcal Polysaccharide-23 07/17/2018   Tdap 10/09/2014     Wt Readings from Last 3 Encounters:  09/05/21 164 lb 9.6 oz (74.7 kg)  03/06/21 170 lb 3.2 oz (77.2 kg)  09/05/20 166 lb (75.3 kg)     Lab Results   Component Value Date   HGBA1C 6.8 (H) 03/06/2021   HGBA1C 6.6 (H) 09/05/2020   HGBA1C 6.0 (H) 03/01/2020   Lab Results  Component Value Date   MICROALBUR <0.7 03/06/2021   Tice 131 (H) 03/06/2021   CREATININE 0.97 03/06/2021      History Patient Active Problem List   Diagnosis Date Noted   Family history of early CAD 11/09/2014   Elevated blood sugar 11/09/2014   Abnormal ECG 11/09/2014   HTN (hypertension) 01/14/2012   Past Medical History:  Diagnosis Date   Diabetes mellitus without complication ([place])    Hypertension    Past Surgical History:  Procedure Laterality Date   none     No Known Allergies Prior to Admission medications   Medication Sig Start Date End Date Taking? Authorizing Provider  losartan (COZAAR) 25 MG tablet Take 1 tablet (25 mg total) by mouth daily. 03/06/21  Yes Wendie Agreste, MD  atorvastatin (LIPITOR) 10 MG tablet Take 1 tablet (10 mg total) by mouth daily at 6 PM. Patient not taking: No sig reported 09/05/20   Wendie Agreste, MD   Social History   Socioeconomic History   Marital status: Married    Spouse name: Not on file   Number of children: 1   Years of education: Not on file   Highest education level: Not on file  Occupational History  Not on file  Tobacco Use   Smoking status: Never   Smokeless tobacco: Never  Vaping Use   Vaping Use: Never used  Substance and Sexual Activity   Alcohol use: Yes   Drug use: No   Sexual activity: Yes  Other Topics Concern   Not on file  Social History Narrative   Not on file   Social Determinants of Health   Financial Resource Strain: Not on file  Food Insecurity: Not on file  Transportation Needs: Not on file  Physical Activity: Not on file  Stress: Not on file  Social Connections: Not on file  Intimate Partner Violence: Not on file    Review of Systems  Constitutional:  Negative for fatigue and unexpected weight change.  Eyes:  Negative for visual disturbance.   Respiratory:  Negative for cough, chest tightness and shortness of breath.   Cardiovascular:  Negative for chest pain, palpitations and leg swelling.  Gastrointestinal:  Negative for abdominal pain and blood in stool.  Neurological:  Negative for dizziness, light-headedness and headaches.    Objective:   Vitals:   09/05/21 0837  BP: 134/80  Pulse: 61  Resp: 15  Temp: 98.1 F (36.7 C)  TempSrc: Temporal  SpO2: 97%  Weight: 164 lb 9.6 oz (74.7 kg)  Height: 5\' 8"  (1.727 m)     Physical Exam Vitals reviewed.  Constitutional:      Appearance: He is well-developed.  HENT:     Head: Normocephalic and atraumatic.  Neck:     Vascular: No carotid bruit or JVD.  Cardiovascular:     Rate and Rhythm: Normal rate and regular rhythm.     Heart sounds: Normal heart sounds. No murmur heard. Pulmonary:     Effort: Pulmonary effort is normal.     Breath sounds: Normal breath sounds. No rales.  Musculoskeletal:     Right lower leg: No edema.     Left lower leg: No edema.  Skin:    General: Skin is warm and dry.  Neurological:     Mental Status: He is alert and oriented to person, place, and time.  Psychiatric:        Mood and Affect: Mood normal.       Assessment & Plan:  Lonnie Richardson is a 50 y.o. male . Type 2 diabetes mellitus with hyperlipidemia (HCC) - Plan: Hemoglobin A1c, Ambulatory referral to Ophthalmology  -Commended on weight loss.  Check labs.  No new meds for now.  Hyperlipidemia, unspecified hyperlipidemia type - Plan: Comprehensive metabolic panel, Lipid panel  -Discussed rationale for use of statins with underlying diabetes and elevated lipids.  Recommend starting once per day.  With weight loss will check updated labs but again recommended statin, even intermittent dosing as option.  Essential hypertension - Plan: losartan (COZAAR) 25 MG tablet, Comprehensive metabolic panel  -Borderline control, option of increase losartan to 50 mg daily.  Home  monitoring recommended.  Needs flu shot - Plan: Flu Vaccine QUAD 36mo+IM (Fluarix, Fluzone & Alfiuria Quad PF)   Meds ordered this encounter  Medications   losartan (COZAAR) 25 MG tablet    Sig: Take 1 tablet (25 mg total) by mouth daily.    Dispense:  90 tablet    Refill:  2   Patient Instructions  [facility] is now offering the bivalent Covid booster vaccine. To schedule an appointment or to find more information, visit [employer]. You can also call [phone], Monday through Friday, 7 a.m. to 7 p.m. Flu vaccine  today.  Congrats on the weight loss! I will let you know the results of your labs. Keep up the good work.  I would recommend starting the atorvastatin once per day for cholesterol and having diabetes. I will recheck levels today.  If blood pressure remains over 130/80 at home, take 2 of the losartan daily and let me know. I can send in the 50mg  dose.  Thanks for coming in today and have a great trip next month.      Signed,   [name], MD [place], [facility] 09/05/21 9:13 AM

## 2021-09-06 DIAGNOSIS — Z23 Encounter for immunization: Secondary | ICD-10-CM

## 2021-09-16 ENCOUNTER — Telehealth: Payer: Self-pay

## 2021-09-16 NOTE — Telephone Encounter (Signed)
Caller name:Adrean Allena Katz   On DPR? :yes  Call back number:[phone]  Provider they see: Neva Seat  Reason for call:Pt was referred to [facility] and they have reached out to him to make appt he said [place] was [place] they then offered him appt in March and pt said he will reach back out at a later date. So this is  Financial planner

## 2022-03-10 ENCOUNTER — Ambulatory Visit (INDEPENDENT_AMBULATORY_CARE_PROVIDER_SITE_OTHER): Payer: Self-pay | Admitting: Family Medicine

## 2022-03-10 ENCOUNTER — Encounter: Payer: Self-pay | Admitting: Family Medicine

## 2022-03-10 VITALS — BP 132/72 | HR 59 | Temp 98.0°F | Resp 16 | Ht 68.0 in | Wt 171.8 lb

## 2022-03-10 DIAGNOSIS — Z23 Encounter for immunization: Secondary | ICD-10-CM

## 2022-03-10 DIAGNOSIS — E1169 Type 2 diabetes mellitus with other specified complication: Secondary | ICD-10-CM

## 2022-03-10 DIAGNOSIS — E785 Hyperlipidemia, unspecified: Secondary | ICD-10-CM

## 2022-03-10 DIAGNOSIS — Z7184 Encounter for health counseling related to travel: Secondary | ICD-10-CM

## 2022-03-10 DIAGNOSIS — I10 Essential (primary) hypertension: Secondary | ICD-10-CM

## 2022-03-10 LAB — HEMOGLOBIN A1C: Hgb A1c MFr Bld: 7.3 % — ABNORMAL HIGH (ref 4.6–6.5)

## 2022-03-10 MED ORDER — LOSARTAN POTASSIUM 25 MG PO TABS: 25.0000 mg | ORAL_TABLET | Freq: Every day | ORAL | 2 refills | Status: DC

## 2022-03-10 MED ORDER — TYPHOID VACCINE PO CPDR: 1.0000 | DELAYED_RELEASE_CAPSULE | ORAL | 0 refills | Status: DC

## 2022-03-10 NOTE — Progress Notes (Signed)
? ?Subjective:  ?Patient ID: Lonnie Richardson, male    DOB: 12/12/1970  Age: 51 y.o. MRN: [id number] ? ?CC:  ?Chief Complaint  ?Patient presents with  ? Diabetes  ?  Due for lab work, no concerns   ? Hyperlipidemia  ?  Doing well due for recheck   ? Hypertension  ?  Pt doing well denies physical sxs.   ? ? ?HPI ?Lonnie Richardson presents for  ? ?Diabetes: ?Previously diet controlled, intermittent fasting at his November visit and was avoiding bread, minimal rice. Diet control  ?Has been prescribed statin and ARB. ?No home readings.  ?Microalbumin: Normal ratio May 2022 ?Optho, foot exam, pneumovax:  ?Up-to-date - saw optho in [place], had reported negative retinopathy screening.  ?Exercising 5-6 days per week, running, yoga. Recently started with trainer.  ?Wt Readings from Last 3 Encounters:  ?03/10/22 171 lb 12.8 oz (77.9 kg)  ?09/05/21 164 lb 9.6 oz (74.7 kg)  ?03/06/21 170 lb 3.2 oz (77.2 kg)  ? ? ? ?Lab Results  ?Component Value Date  ? HGBA1C 7.0 (H) 09/05/2021  ? HGBA1C 6.8 (H) 03/06/2021  ? HGBA1C 6.6 (H) 09/05/2020  ? ?Lab Results  ?Component Value Date  ? MICROALBUR <0.7 03/06/2021  ? LDLCALC 135 (H) 09/05/2021  ? CREATININE 1.03 09/05/2021  ? ?Hypertension: ?Borderline control previously, treated with losartan 25 mg daily with option of increase to 50 mg. ?Home readings: stable, 125-130/80 ?BP Readings from Last 3 Encounters:  ?03/10/22 132/72  ?09/05/21 134/80  ?03/06/21 138/82  ? ?Lab Results  ?Component Value Date  ? CREATININE 1.03 09/05/2021  ? ? ?Hyperlipidemia: ?Benefits of statin and diabetes discussed previously, we have prescribed Lipitor 10 mg, not taking at his November visit.  Commended starting statin, at least intermittent dosing. ?Has not taken atorvastatin. No side effects, just does not want to take.  ?The 10-year ASCVD risk score (Arnett DK, et al., 2019) is: 6.9% ?  Values used to calculate the score: ?    Age: 51 years ?    Sex: Male ?    Is Non-Hispanic African American: No ?     Diabetic: Yes ?    Tobacco smoker: No ?    Systolic Blood Pressure: 132 mmHg ?    Is BP treated: Yes ?    HDL Cholesterol: 48.3 mg/dL ?    Total Cholesterol: 200 mg/dL ? ?Lab Results  ?Component Value Date  ? CHOL 200 09/05/2021  ? HDL 48.30 09/05/2021  ? LDLCALC 135 (H) 09/05/2021  ? TRIG 86.0 09/05/2021  ? CHOLHDL 4 09/05/2021  ? ?Lab Results  ?Component Value Date  ? ALT 19 09/05/2021  ? AST 17 09/05/2021  ? ALKPHOS 43 09/05/2021  ? BILITOT 0.8 09/05/2021  ? ? ?Immunization History  ?Administered Date(s) Administered  ? Influenza,inj,Quad PF,6+ Mos 08/24/2019, 09/05/2020, 09/06/2021  ? PFIZER(Purple Top)SARS-COV-2 Vaccination 11/22/2019, 12/22/2019, 11/05/2020  ? Pneumococcal Polysaccharide-23 07/17/2018  ? Tdap 10/09/2014  ?Covid bivalent booster recommended.  ? Traveling to [place] in 4 days, 11 days.  ?Hep A vaccine, Hep B vaccine declined. ?Malaria prophylaxis recommended - declined  ?Typhoid vaccine - agrees to vivotif. vi ? ? ?History ?Patient Active Problem List  ? Diagnosis Date Noted  ? Family history of early CAD 11/09/2014  ? Elevated blood sugar 11/09/2014  ? Abnormal ECG 11/09/2014  ? HTN (hypertension) 01/14/2012  ? ?Past Medical History:  ?Diagnosis Date  ? Diabetes mellitus without complication (HCC)   ? Hypertension   ? ?Past Surgical History:  ?Procedure  Laterality Date  ? none    ? ?No Known Allergies ?Prior to Admission medications   ?Medication Sig Start Date End Date Taking? Authorizing Provider  ?atorvastatin (LIPITOR) 10 MG tablet Take 1 tablet (10 mg total) by mouth daily at 6 PM. ?Patient not taking: No sig reported 09/05/20   Shade Flood, MD  ?losartan (COZAAR) 25 MG tablet Take 1 tablet (25 mg total) by mouth daily. 09/05/21   Shade Flood, MD  ? ?Social History  ? ?Socioeconomic History  ? Marital status: Married  ?  Spouse name: Not on file  ? Number of children: 1  ? Years of education: Not on file  ? Highest education level: Not on file  ?Occupational History  ? Not on  file  ?Tobacco Use  ? Smoking status: Never  ? Smokeless tobacco: Never  ?Vaping Use  ? Vaping Use: Never used  ?Substance and Sexual Activity  ? Alcohol use: Yes  ? Drug use: No  ? Sexual activity: Yes  ?Other Topics Concern  ? Not on file  ?Social History Narrative  ? Not on file  ? ?Social Determinants of Health  ? ?Financial Resource Strain: Not on file  ?Food Insecurity: Not on file  ?Transportation Needs: Not on file  ?Physical Activity: Not on file  ?Stress: Not on file  ?Social Connections: Not on file  ?Intimate Partner Violence: Not on file  ? ? ?Review of Systems  ?Constitutional:  Negative for fatigue and unexpected weight change.  ?Eyes:  Negative for visual disturbance.  ?Respiratory:  Negative for cough, chest tightness and shortness of breath.   ?Cardiovascular:  Negative for chest pain, palpitations and leg swelling.  ?Gastrointestinal:  Negative for abdominal pain and blood in stool.  ?Neurological:  Negative for dizziness, light-headedness and headaches.  ? ? ?Objective:  ? ?Vitals:  ? 03/10/22 0843  ?BP: 132/72  ?Pulse: (!) 59  ?Resp: 16  ?Temp: 98 ?F (36.7 ?C)  ?TempSrc: Temporal  ?SpO2: 97%  ?Weight: 171 lb 12.8 oz (77.9 kg)  ?Height: 5\' 8"  (1.727 m)  ? ? ? ?Physical Exam ?Vitals reviewed.  ?Constitutional:   ?   Appearance: He is well-developed.  ?HENT:  ?   Head: Normocephalic and atraumatic.  ?Neck:  ?   Vascular: No carotid bruit or JVD.  ?Cardiovascular:  ?   Rate and Rhythm: Normal rate and regular rhythm.  ?   Heart sounds: Normal heart sounds. No murmur heard. ?Pulmonary:  ?   Effort: Pulmonary effort is normal.  ?   Breath sounds: Normal breath sounds. No rales.  ?Musculoskeletal:  ?   Right lower leg: No edema.  ?   Left lower leg: No edema.  ?Skin: ?   General: Skin is warm and dry.  ?Neurological:  ?   Mental Status: He is alert and oriented to person, place, and time.  ?Psychiatric:     ?   Mood and Affect: Mood normal.  ? ? ? ? ? ?Assessment & Plan:  ?Lonnie Richardson is a 51  y.o. male . ?Type 2 diabetes mellitus with hyperlipidemia (HCC) - Plan: Hemoglobin A1c ? - check labs, consider starting meds if A1c still elevated.  ? ?Hyperlipidemia, unspecified hyperlipidemia type - Plan: Lipid panel ? - again discussed rationale for statin given HLD, and diabetes.  ? ?Essential hypertension - Plan: Comprehensive metabolic panel, losartan (COZAAR) 25 MG tablet ? -  Stable, tolerating current regimen. Medications refilled. Labs pending as above.  ? ?Counseling for  travel ?Need for prophylactic vaccination with typhoid-paratyphoid (TAB) vaccine - Plan: typhoid (VIVOTIF) DR capsule ? -Prescription given for typhoid vaccine, if not available at pharmacy advised to contact health department for alternatives. ? -Hepatitis A, B vaccines recommended, declined ? -Malaria prophylaxis recommended, declined.  Importance of mosquito prevention discussed. ? Neva Seat to [facility] website to review other recommendations, option of nurse visit for other vaccines if he changes his mind prior to leaving. ? ?Meds ordered this encounter  ?Medications  ? losartan (COZAAR) 25 MG tablet  ?  Sig: Take 1 tablet (25 mg total) by mouth daily.  ?  Dispense:  90 tablet  ?  Refill:  2  ? typhoid (VIVOTIF) DR capsule  ?  Sig: Take 1 capsule by mouth every other day.  ?  Dispense:  4 capsule  ?  Refill:  0  ? ?Patient Instructions  ?I again recommend taking the atorvastatin at least a few days per week sue to diabetes and elevated cholesterol.  ?Make sure to keep skin covered or use mosquito repellant with DEET to help prevent bites and malaria.  ?Vivotif for typhoid vaccine. If not available at pharmacy, contact health department to discuss options.  ?I do recommend getting covid booster.  ?I will let you know if there are any concerns on your labs.  Thanks for coming in today.  ? ? ? ?[email or web address] ? ? ? ?Signed,  ? ?[name], MD ?[facility]  Primary Care, [facility] ?[facility] ?03/10/22 ?9:15 AM ? ? ?

## 2022-03-10 NOTE — Patient Instructions (Addendum)
I again recommend taking the atorvastatin at least a few days per week sue to diabetes and elevated cholesterol.  ?Make sure to keep skin covered or use mosquito repellant with DEET to help prevent bites and malaria.  ?Vivotif for typhoid vaccine. If not available at pharmacy, contact health department to discuss options.  ?I do recommend getting covid booster.  ?I will let you know if there are any concerns on your labs.  Thanks for coming in today.  ? ? ? ?[email or web address] ?

## 2022-03-11 LAB — COMPREHENSIVE METABOLIC PANEL
ALT: 19 U/L (ref 0–53)
AST: 12 U/L (ref 0–37)
Albumin: 4.4 g/dL (ref 3.5–5.2)
Alkaline Phosphatase: 54 U/L (ref 39–117)
BUN: 10 mg/dL (ref 6–23)
CO2: 27 mEq/L (ref 19–32)
Calcium: 9.4 mg/dL (ref 8.4–10.5)
Chloride: 102 mEq/L (ref 96–112)
Creatinine, Ser: 0.85 mg/dL (ref 0.40–1.50)
GFR: 102.56 mL/min (ref >60.00)
Glucose, Bld: 151 mg/dL — ABNORMAL HIGH (ref 70–99)
Potassium: 4.7 mEq/L (ref 3.5–5.1)
Sodium: 138 mEq/L (ref 135–145)
Total Bilirubin: 0.4 mg/dL (ref 0.2–1.2)
Total Protein: 7 g/dL (ref 6.0–8.3)

## 2022-03-11 LAB — LIPID PANEL
Cholesterol: 205 mg/dL — ABNORMAL HIGH (ref 0–200)
HDL: 56.6 mg/dL (ref >39.00)
LDL Cholesterol: 130 mg/dL — ABNORMAL HIGH (ref 0–99)
NonHDL: 148.24
Total CHOL/HDL Ratio: 4
Triglycerides: 93 mg/dL (ref 0.0–149.0)
VLDL: 18.6 mg/dL (ref 0.0–40.0)

## 2022-09-29 ENCOUNTER — Ambulatory Visit (INDEPENDENT_AMBULATORY_CARE_PROVIDER_SITE_OTHER): Payer: Self-pay | Admitting: Family Medicine

## 2022-09-29 ENCOUNTER — Encounter: Payer: Self-pay | Admitting: Family Medicine

## 2022-09-29 VITALS — BP 122/72 | HR 75 | Temp 101.1°F | Ht 68.0 in | Wt 173.2 lb

## 2022-09-29 DIAGNOSIS — J101 Influenza due to other identified influenza virus with other respiratory manifestations: Secondary | ICD-10-CM

## 2022-09-29 DIAGNOSIS — M25562 Pain in left knee: Secondary | ICD-10-CM

## 2022-09-29 DIAGNOSIS — E785 Hyperlipidemia, unspecified: Secondary | ICD-10-CM

## 2022-09-29 DIAGNOSIS — Z1159 Encounter for screening for other viral diseases: Secondary | ICD-10-CM

## 2022-09-29 DIAGNOSIS — I10 Essential (primary) hypertension: Secondary | ICD-10-CM

## 2022-09-29 DIAGNOSIS — R059 Cough, unspecified: Secondary | ICD-10-CM

## 2022-09-29 DIAGNOSIS — E1169 Type 2 diabetes mellitus with other specified complication: Secondary | ICD-10-CM

## 2022-09-29 LAB — COMPREHENSIVE METABOLIC PANEL
ALT: 23 U/L (ref 0–53)
AST: 20 U/L (ref 0–37)
Albumin: 4.3 g/dL (ref 3.5–5.2)
Alkaline Phosphatase: 36 U/L — ABNORMAL LOW (ref 39–117)
BUN: 9 mg/dL (ref 6–23)
CO2: 28 mEq/L (ref 19–32)
Calcium: 8.9 mg/dL (ref 8.4–10.5)
Chloride: 101 mEq/L (ref 96–112)
Creatinine, Ser: 1.13 mg/dL (ref 0.40–1.50)
GFR: 76.42 mL/min (ref >60.00)
Glucose, Bld: 121 mg/dL — ABNORMAL HIGH (ref 70–99)
Potassium: 4.2 mEq/L (ref 3.5–5.1)
Sodium: 135 mEq/L (ref 135–145)
Total Bilirubin: 0.5 mg/dL (ref 0.2–1.2)
Total Protein: 7.4 g/dL (ref 6.0–8.3)

## 2022-09-29 LAB — LIPID PANEL
Cholesterol: 166 mg/dL (ref 0–200)
HDL: 39.9 mg/dL (ref >39.00)
LDL Cholesterol: 114 mg/dL — ABNORMAL HIGH (ref 0–99)
NonHDL: 126
Total CHOL/HDL Ratio: 4
Triglycerides: 61 mg/dL (ref 0.0–149.0)
VLDL: 12.2 mg/dL (ref 0.0–40.0)

## 2022-09-29 LAB — POC INFLUENZA A&B (BINAX/QUICKVUE)
Influenza A, POC: POSITIVE — AB
Influenza B, POC: NEGATIVE

## 2022-09-29 LAB — MICROALBUMIN / CREATININE URINE RATIO
Creatinine,U: 199 mg/dL
Microalb Creat Ratio: 1.1 mg/g (ref 0.0–30.0)
Microalb, Ur: 2.1 mg/dL — ABNORMAL HIGH (ref 0.0–1.9)

## 2022-09-29 LAB — POC COVID19 BINAXNOW: SARS Coronavirus 2 Ag: NEGATIVE

## 2022-09-29 LAB — HEMOGLOBIN A1C: Hgb A1c MFr Bld: 7.5 % — ABNORMAL HIGH (ref 4.6–6.5)

## 2022-09-29 MED ORDER — LOSARTAN POTASSIUM 25 MG PO TABS: 25.0000 mg | ORAL_TABLET | Freq: Every day | ORAL | 2 refills | Status: DC

## 2022-09-29 MED ORDER — OSELTAMIVIR PHOSPHATE 75 MG PO CAPS: 75.0000 mg | ORAL_CAPSULE | Freq: Two times a day (BID) | ORAL | 0 refills | Status: DC

## 2022-09-29 NOTE — Progress Notes (Signed)
Subjective:  Patient ID: Lonnie Richardson, male    DOB: 1971/03/01  Age: 51 y.o. MRN: [id number]  CC:  Chief Complaint  Patient presents with   Diabetes   Hyperlipidemia   Hypertension    Pt states all is well   Cough    Pt has had a cough for the last 2 days, weakness   Depression    PHQ9 - 9    HPI Lonnie Richardson presents for   Initially scheduled for routine follow-up of chronic medical conditions, but now with acute illness.      09/29/2022    8:43 AM 03/10/2022    8:46 AM 09/05/2021    8:40 AM 09/05/2020    8:48 AM 03/01/2020    2:42 PM  Depression screen PHQ 2/9  Decreased Interest 2 0 0 0 0  Down, Depressed, Hopeless 0 0 0 0 0  PHQ - 2 Score 2 0 0 0 0  Altered sleeping 2      Tired, decreased energy 2      Change in appetite 0      Feeling bad or failure about yourself  0      Trouble concentrating 0      Moving slowly or fidgety/restless 2      Suicidal thoughts 0      PHQ-9 Score 8      Positive PHQ with acute illness, see below.   Cough: Started 2 days ago, generalized fatigue. Felt cold, chills, dry cough. Did not measure temp. No myalgias. No HA.  No CP/dyspnea. Drinking fluids, no confusion.  No known sick contacts.  No home covid testing.  Last covid booster last year.  No flu vaccine this year.  Tx: tylenol   Was in [place] for [place] until 11/28. Felt well until 2 days ago.  No rash.   Immunization History  Administered Date(s) Administered   Influenza,inj,Quad PF,6+ Mos 08/24/2019, 09/05/2020, 09/06/2021   PFIZER(Purple Top)SARS-COV-2 Vaccination 11/22/2019, 12/22/2019, 11/05/2020   Pneumococcal Polysaccharide-23 07/17/2018   Tdap 10/09/2014   Hypertension: Losartan 25 mg daily. No new side effects.  Home readings: 125/85 BP Readings from Last 3 Encounters:  09/29/22 122/72  03/10/22 132/72  09/05/21 134/80   Lab Results  Component Value Date   CREATININE 0.85 03/10/2022   Hyperlipidemia: Statins have been discussed,  recommended with history of diabetes.  Declined, diet/exercise approach. Still declines statins.  Lab Results  Component Value Date   CHOL 205 (H) 03/10/2022   HDL 56.60 03/10/2022   LDLCALC 130 (H) 03/10/2022   TRIG 93.0 03/10/2022   CHOLHDL 4 03/10/2022   Lab Results  Component Value Date   ALT 19 03/10/2022   AST 12 03/10/2022   ALKPHOS 54 03/10/2022   BILITOT 0.4 03/10/2022   Diabetes: With hyperlipidemia, hyperglycemia.  Treated with diet/exercise approach. Increased A1c in May, recommended starting metformin.  Declined.  Was exercising 5 to 6 days/week with running, yoga and recently started working with a trainer at his May visit. Statin has been recommended, declined.  He is on ARB for hypertension. Microalbumin: Normal 03/06/2021 Optho, foot exam, pneumovax:  Optho in [place] - January - no apparent diabetic issues.  Agrees to hep c test Wt Readings from Last 3 Encounters:  09/29/22 173 lb 3.2 oz (78.6 kg)  03/10/22 171 lb 12.8 oz (77.9 kg)  09/05/21 164 lb 9.6 oz (74.7 kg)    Lab Results  Component Value Date   HGBA1C 7.3 (H) 03/10/2022   HGBA1C 7.0 (H)  09/05/2021   HGBA1C 6.8 (H) 03/06/2021   Lab Results  Component Value Date   MICROALBUR <0.7 03/06/2021   LDLCALC 130 (H) 03/10/2022   CREATININE 0.85 03/10/2022   L knee pain: Past year at times. Sore if sitting, better with active. No swelling, uses brace at times.   History Patient Active Problem List   Diagnosis Date Noted   Family history of early CAD 11/09/2014   Elevated blood sugar 11/09/2014   Abnormal ECG 11/09/2014   HTN (hypertension) 01/14/2012   Past Medical History:  Diagnosis Date   Diabetes mellitus without complication (HCC)    Hypertension    Past Surgical History:  Procedure Laterality Date   none     No Known Allergies Prior to Admission medications   Medication Sig Start Date End Date Taking? Authorizing Provider  losartan (COZAAR) 25 MG tablet Take 1 tablet (25 mg total) by  mouth daily. 03/10/22  Yes Shade Flood, MD   Social History   Socioeconomic History   Marital status: Married    Spouse name: Not on file   Number of children: 1   Years of education: Not on file   Highest education level: Not on file  Occupational History   Not on file  Tobacco Use   Smoking status: Never   Smokeless tobacco: Never  Vaping Use   Vaping Use: Never used  Substance and Sexual Activity   Alcohol use: Yes   Drug use: No   Sexual activity: Yes  Other Topics Concern   Not on file  Social History Narrative   Not on file   Social Determinants of Health   Financial Resource Strain: Not on file  Food Insecurity: Not on file  Transportation Needs: Not on file  Physical Activity: Not on file  Stress: Not on file  Social Connections: Not on file  Intimate Partner Violence: Not on file    Review of Systems  Constitutional:  Positive for fever. Negative for fatigue and unexpected weight change.  Eyes:  Negative for visual disturbance.  Respiratory:  Negative for cough, chest tightness and shortness of breath.   Cardiovascular:  Negative for chest pain, palpitations and leg swelling.  Gastrointestinal:  Negative for abdominal pain and blood in stool.  Neurological:  Negative for dizziness, light-headedness and headaches.     Objective:   Vitals:   09/29/22 0846  BP: 122/72  Pulse: 75  Temp: (!) 101.1 F (38.4 C)  SpO2: 97%  Weight: 173 lb 3.2 oz (78.6 kg)  Height:  (1.727 m)     Physical Exam Vitals reviewed.  Constitutional:      Appearance: He is well-developed.  HENT:     Head: Normocephalic and atraumatic.     Right Ear: Tympanic membrane, ear canal and external ear normal.     Left Ear: Tympanic membrane, ear canal and external ear normal.     Nose: Rhinorrhea present.  Eyes:     Conjunctiva/sclera: Conjunctivae normal.     Pupils: Pupils are equal, round, and reactive to light.  Neck:     Vascular: No carotid bruit or JVD.   Cardiovascular:     Rate and Rhythm: Normal rate and regular rhythm.     Heart sounds: Normal heart sounds. No murmur heard. Pulmonary:     Effort: Pulmonary effort is normal.     Breath sounds: Normal breath sounds. No wheezing, rhonchi or rales.  Abdominal:     Palpations: Abdomen is soft.  Tenderness: There is no abdominal tenderness.  Musculoskeletal:     Cervical back: Neck supple.     Right lower leg: No edema.     Left lower leg: No edema.     Comments: Left knee, skin intact, no erythema, no ecchymosis, no effusion appreciated.  Full range of motion.  Locates area of previous discomfort at the medial joint line but nontender on exam.  Negative McMurray, varus/valgus stress.  Lymphadenopathy:     Cervical: No cervical adenopathy.  Skin:    General: Skin is warm and dry.     Findings: No rash.  Neurological:     Mental Status: He is alert and oriented to person, place, and time.  Psychiatric:        Mood and Affect: Mood normal.        Behavior: Behavior normal.     Results for orders placed or performed in visit on 09/29/22  POC COVID-19  Result Value Ref Range   SARS Coronavirus 2 Ag Negative Negative  POC Influenza A&B(BINAX/QUICKVUE)  Result Value Ref Range   Influenza A, POC Positive (A) Negative   Influenza B, POC Negative Negative      Assessment & Plan:  Lonnie Richardson is a 51 y.o. male . Cough in adult - Plan: POC COVID-19 Influenza A - Plan: oseltamivir (TAMIFLU) 75 MG capsule  -Influenza A.  Overall reassuring exam.  Outpatient treatment at this time.  Pros and cons of Tamiflu discussed, chose to take, prescription given.  Other symptomatic care discussed with RTC precautions/ER precautions, handout given.  Type 2 diabetes mellitus with hyperlipidemia (HCC) - Plan: Comprehensive metabolic panel, Lipid panel, Microalbumin / creatinine urine ratio, Hemoglobin A1c  -Check updated A1c. Would recommend metformin or medication if A1c above 7 as  recommended previously.  Also discussed statin, declined.  Essential hypertension - Plan: losartan (COZAAR) 25 MG tablet  -  Stable, tolerating current regimen. Medications refilled. Labs pending as above.   Encounter for hepatitis C screening test for low risk patient - Plan: Hepatitis C antibody  Left knee pain, unspecified chronicity  -Reassuring exam, RTC precautions if persistent symptoms as may need imaging.  Differential includes degenerative joint disease based on medial joint line discomfort.  No appreciable mechanical symptoms, no known injury, imaging deferred at this time.  Meds ordered this encounter  Medications   losartan (COZAAR) 25 MG tablet    Sig: Take 1 tablet (25 mg total) by mouth daily.    Dispense:  90 tablet    Refill:  2   oseltamivir (TAMIFLU) 75 MG capsule    Sig: Take 1 capsule (75 mg total) by mouth 2 (two) times daily.    Dispense:  10 capsule    Refill:  0   Patient Instructions  Unfortunately you do have influenza.  See information below.  Start Tamiflu as soon as possible as that needs to be taken in the first 48 hours to be effective.  Tylenol as needed for body aches or headache, Mucinex if needed for cough.  Make sure to drink plenty of fluids and rest.  I expect you to be feeling better in the next few days.Return to the clinic or go to the nearest emergency room if any of your symptoms worsen or new symptoms occur.  Knee exam is overall reassuring today but please follow-up to discuss that further if pain continues.  No med changes today but I will let you know if there are recommendations or concerns based on your  labs.  Take care.   Influenza, Adult Influenza, also called "the flu," is a viral infection that mainly affects the respiratory tract. This includes the lungs, nose, and throat. The flu spreads easily from person to person (is contagious). It causes common cold symptoms, along with high fever and body aches. What are the causes? This  condition is caused by the influenza virus. You can get the virus by: Breathing in droplets that are in the air from an infected person's cough or sneeze. Touching something that has the virus on it (has been contaminated) and then touching your mouth, nose, or eyes. What increases the risk? The following factors may make you more likely to get the flu: Not washing or sanitizing your hands often. Having close contact with many people during cold and flu season. Touching your mouth, eyes, or nose without first washing or sanitizing your hands. Not getting an annual flu shot. You may have a higher risk for the flu, including serious problems, such as a lung infection (pneumonia), if you: Are older than 65. Are pregnant. Have a weakened disease-fighting system (immune system). This includes people who have HIV or AIDS, are on chemotherapy, or are taking medicines that reduce (suppress) the immune system. Have a long-term (chronic) illness, such as heart disease, kidney disease, diabetes, or lung disease. Have a liver disorder. Are severely overweight (morbidly obese). Have anemia. Have asthma. What are the signs or symptoms? Symptoms of this condition usually begin suddenly and last 4-14 days. These may include: Fever and chills. Headaches, body aches, or muscle aches. Sore throat. Cough. Runny or stuffy (congested) nose. Chest discomfort. Poor appetite. Weakness or fatigue. Dizziness. Nausea or vomiting. How is this diagnosed? This condition may be diagnosed based on: Your symptoms and medical history. A physical exam. Swabbing your nose or throat and testing the fluid for the influenza virus. How is this treated? If the flu is diagnosed early, you can be treated with antiviral medicine that is given by mouth (orally) or through an IV. This can help reduce how severe the illness is and how long it lasts. Taking care of yourself at home can help relieve symptoms. Your health care  provider may recommend: Taking over-the-counter medicines. Drinking plenty of fluids. In many cases, the flu goes away on its own. If you have severe symptoms or complications, you may be treated in a hospital. Follow these instructions at home: Activity Rest as needed and get plenty of sleep. Stay home from work or school as told by your health care provider. Unless you are visiting your health care provider, avoid leaving home until your fever has been gone for 24 hours without taking medicine. Eating and drinking Take an oral rehydration solution (ORS). This is a drink that is sold at pharmacies and retail stores. Drink enough fluid to keep your urine pale yellow. Drink clear fluids in small amounts as you are able. Clear fluids include water, ice chips, fruit juice mixed with water, and low-calorie sports drinks. Eat bland, easy-to-digest foods in small amounts as you are able. These foods include bananas, applesauce, rice, lean meats, toast, and crackers. Avoid drinking fluids that contain a lot of sugar or caffeine, such as energy drinks, regular sports drinks, and soda. Avoid alcohol. Avoid spicy or fatty foods. General instructions     Take over-the-counter and prescription medicines only as told by your health care provider. Use a cool mist humidifier to add humidity to the air in your home. This can make it  easier to breathe. When using a cool mist humidifier, clean it daily. Empty the water and replace it with clean water. Cover your mouth and nose when you cough or sneeze. Wash your hands with soap and water often and for at least 20 seconds, especially after you cough or sneeze. If soap and water are not available, use alcohol-based hand sanitizer. Keep all follow-up visits. This is important. How is this prevented?  Get an annual flu shot. This is usually available in late summer, fall, or winter. Ask your health care provider when you should get your flu shot. Avoid  contact with people who are sick during cold and flu season. This is generally fall and winter. Contact a health care provider if: You develop new symptoms. You have: Chest pain. Diarrhea. A fever. Your cough gets worse. You produce more mucus. You feel nauseous or you vomit. Get help right away if you: Develop shortness of breath or have difficulty breathing. Have skin or nails that turn a bluish color. Have severe pain or stiffness in your neck. Develop a sudden headache or sudden pain in your face or ear. Cannot eat or drink without vomiting. These symptoms may represent a serious problem that is an emergency. Do not wait to see if the symptoms will go away. Get medical help right away. Call your local emergency services (911 in the [place] Do not drive yourself to the hospital. Summary Influenza, also called "the flu," is a viral infection that primarily affects your respiratory tract. Symptoms of the flu usually begin suddenly and last 4-14 days. Getting an annual flu shot is the best way to prevent getting the flu. Stay home from work or school as told by your health care provider. Unless you are visiting your health care provider, avoid leaving home until your fever has been gone for 24 hours without taking medicine. Keep all follow-up visits. This is important. This information is not intended to replace advice given to you by your health care provider. Make sure you discuss any questions you have with your health care provider. Document Revised: 06/01/2020 Document Reviewed: 06/01/2020 Elsevier Patient Education  2023 [employer]vier Inc.       Signed,   [name], MD [facility], [facility] 09/29/22 9:21 AM

## 2022-09-29 NOTE — Patient Instructions (Signed)
Unfortunately you do have influenza.  See information below.  Start Tamiflu as soon as possible as that needs to be taken in the first 48 hours to be effective.  Tylenol as needed for body aches or headache, Mucinex if needed for cough.  Make sure to drink plenty of fluids and rest.  I expect you to be feeling better in the next few days.Return to the clinic or go to the nearest emergency room if any of your symptoms worsen or new symptoms occur.  Knee exam is overall reassuring today but please follow-up to discuss that further if pain continues.  No med changes today but I will let you know if there are recommendations or concerns based on your labs.  Take care.   Influenza, Adult Influenza, also called "the flu," is a viral infection that mainly affects the respiratory tract. This includes the lungs, nose, and throat. The flu spreads easily from person to person (is contagious). It causes common cold symptoms, along with high fever and body aches. What are the causes? This condition is caused by the influenza virus. You can get the virus by: Breathing in droplets that are in the air from an infected person's cough or sneeze. Touching something that has the virus on it (has been contaminated) and then touching your mouth, nose, or eyes. What increases the risk? The following factors may make you more likely to get the flu: Not washing or sanitizing your hands often. Having close contact with many people during cold and flu season. Touching your mouth, eyes, or nose without first washing or sanitizing your hands. Not getting an annual flu shot. You may have a higher risk for the flu, including serious problems, such as a lung infection (pneumonia), if you: Are older than 65. Are pregnant. Have a weakened disease-fighting system (immune system). This includes people who have HIV or AIDS, are on chemotherapy, or are taking medicines that reduce (suppress) the immune system. Have a long-term  (chronic) illness, such as heart disease, kidney disease, diabetes, or lung disease. Have a liver disorder. Are severely overweight (morbidly obese). Have anemia. Have asthma. What are the signs or symptoms? Symptoms of this condition usually begin suddenly and last 4-14 days. These may include: Fever and chills. Headaches, body aches, or muscle aches. Sore throat. Cough. Runny or stuffy (congested) nose. Chest discomfort. Poor appetite. Weakness or fatigue. Dizziness. Nausea or vomiting. How is this diagnosed? This condition may be diagnosed based on: Your symptoms and medical history. A physical exam. Swabbing your nose or throat and testing the fluid for the influenza virus. How is this treated? If the flu is diagnosed early, you can be treated with antiviral medicine that is given by mouth (orally) or through an IV. This can help reduce how severe the illness is and how long it lasts. Taking care of yourself at home can help relieve symptoms. Your health care provider may recommend: Taking over-the-counter medicines. Drinking plenty of fluids. In many cases, the flu goes away on its own. If you have severe symptoms or complications, you may be treated in a hospital. Follow these instructions at home: Activity Rest as needed and get plenty of sleep. Stay home from work or school as told by your health care provider. Unless you are visiting your health care provider, avoid leaving home until your fever has been gone for 24 hours without taking medicine. Eating and drinking Take an oral rehydration solution (ORS). This is a drink that is sold at pharmacies  and retail stores. Drink enough fluid to keep your urine pale yellow. Drink clear fluids in small amounts as you are able. Clear fluids include water, ice chips, fruit juice mixed with water, and low-calorie sports drinks. Eat bland, easy-to-digest foods in small amounts as you are able. These foods include bananas, applesauce,  rice, lean meats, toast, and crackers. Avoid drinking fluids that contain a lot of sugar or caffeine, such as energy drinks, regular sports drinks, and soda. Avoid alcohol. Avoid spicy or fatty foods. General instructions     Take over-the-counter and prescription medicines only as told by your health care provider. Use a cool mist humidifier to add humidity to the air in your home. This can make it easier to breathe. When using a cool mist humidifier, clean it daily. Empty the water and replace it with clean water. Cover your mouth and nose when you cough or sneeze. Wash your hands with soap and water often and for at least 20 seconds, especially after you cough or sneeze. If soap and water are not available, use alcohol-based hand sanitizer. Keep all follow-up visits. This is important. How is this prevented?  Get an annual flu shot. This is usually available in late summer, fall, or winter. Ask your health care provider when you should get your flu shot. Avoid contact with people who are sick during cold and flu season. This is generally fall and winter. Contact a health care provider if: You develop new symptoms. You have: Chest pain. Diarrhea. A fever. Your cough gets worse. You produce more mucus. You feel nauseous or you vomit. Get help right away if you: Develop shortness of breath or have difficulty breathing. Have skin or nails that turn a bluish color. Have severe pain or stiffness in your neck. Develop a sudden headache or sudden pain in your face or ear. Cannot eat or drink without vomiting. These symptoms may represent a serious problem that is an emergency. Do not wait to see if the symptoms will go away. Get medical help right away. Call your local emergency services (911 in the [place] Do not drive yourself to the hospital. Summary Influenza, also called "the flu," is a viral infection that primarily affects your respiratory tract. Symptoms of the flu usually begin  suddenly and last 4-14 days. Getting an annual flu shot is the best way to prevent getting the flu. Stay home from work or school as told by your health care provider. Unless you are visiting your health care provider, avoid leaving home until your fever has been gone for 24 hours without taking medicine. Keep all follow-up visits. This is important. This information is not intended to replace advice given to you by your health care provider. Make sure you discuss any questions you have with your health care provider. Document Revised: 06/01/2020 Document Reviewed: 06/01/2020 Elsevier Patient Education  2023 [employer].

## 2022-09-30 LAB — HEPATITIS C ANTIBODY: Hepatitis C Ab: NONREACTIVE

## 2022-12-31 ENCOUNTER — Ambulatory Visit: Payer: Self-pay | Admitting: Family Medicine

## 2023-01-01 ENCOUNTER — Ambulatory Visit (INDEPENDENT_AMBULATORY_CARE_PROVIDER_SITE_OTHER): Payer: PRIVATE HEALTH INSURANCE | Admitting: Family Medicine

## 2023-01-01 ENCOUNTER — Encounter: Payer: Self-pay | Admitting: Family Medicine

## 2023-01-01 VITALS — BP 124/78 | HR 62 | Temp 98.2°F | Ht 68.0 in | Wt 165.0 lb

## 2023-01-01 DIAGNOSIS — E1169 Type 2 diabetes mellitus with other specified complication: Secondary | ICD-10-CM | POA: Insufficient documentation

## 2023-01-01 DIAGNOSIS — E7800 Pure hypercholesterolemia, unspecified: Secondary | ICD-10-CM | POA: Insufficient documentation

## 2023-01-01 DIAGNOSIS — E785 Hyperlipidemia, unspecified: Secondary | ICD-10-CM | POA: Diagnosis not present

## 2023-01-01 DIAGNOSIS — I10 Essential (primary) hypertension: Secondary | ICD-10-CM

## 2023-01-01 LAB — HEMOGLOBIN A1C: Hgb A1c MFr Bld: 7.1 % — ABNORMAL HIGH (ref 4.6–6.5)

## 2023-01-01 LAB — COMPREHENSIVE METABOLIC PANEL
ALT: 14 U/L (ref 0–53)
AST: 16 U/L (ref 0–37)
Albumin: 4.1 g/dL (ref 3.5–5.2)
Alkaline Phosphatase: 45 U/L (ref 39–117)
BUN: 9 mg/dL (ref 6–23)
CO2: 29 mEq/L (ref 19–32)
Calcium: 9.6 mg/dL (ref 8.4–10.5)
Chloride: 102 mEq/L (ref 96–112)
Creatinine, Ser: 0.97 mg/dL (ref 0.40–1.50)
GFR: 91.62 mL/min (ref >60.00)
Glucose, Bld: 142 mg/dL — ABNORMAL HIGH (ref 70–99)
Potassium: 4.4 mEq/L (ref 3.5–5.1)
Sodium: 137 mEq/L (ref 135–145)
Total Bilirubin: 0.5 mg/dL (ref 0.2–1.2)
Total Protein: 7.1 g/dL (ref 6.0–8.3)

## 2023-01-01 LAB — LIPID PANEL
Cholesterol: 205 mg/dL — ABNORMAL HIGH (ref 0–200)
HDL: 54.4 mg/dL (ref >39.00)
LDL Cholesterol: 132 mg/dL — ABNORMAL HIGH (ref 0–99)
NonHDL: 150.62
Total CHOL/HDL Ratio: 4
Triglycerides: 91 mg/dL (ref 0.0–149.0)
VLDL: 18.2 mg/dL (ref 0.0–40.0)

## 2023-01-01 MED ORDER — BLOOD GLUCOSE TEST VI STRP: 1.0000 | ORAL_STRIP | Freq: Every day | 0 refills | Status: AC

## 2023-01-01 MED ORDER — BLOOD GLUCOSE MONITORING SUPPL DEVI: 1.0000 | Freq: Every day | 0 refills | Status: AC

## 2023-01-01 MED ORDER — LANCET DEVICE MISC: 1.0000 | Freq: Every day | 0 refills | Status: AC

## 2023-01-01 MED ORDER — LANCETS MISC. MISC: 1.0000 | Freq: Every day | 0 refills | Status: AC

## 2023-01-01 NOTE — Patient Instructions (Signed)
No change in meds today, but based on your labs I would recommend starting the metformin and possibly cholesterol medication.  Hoping those numbers have improved with your efforts.  Keep up the good work.  Recheck in 3 months.  I will refer you to eye specialist for diabetic retinopathy screening.  Let me know if there are questions and take care.

## 2023-01-01 NOTE — Assessment & Plan Note (Signed)
Complicated by hyperglycemia, hyperlipidemia, and elevated microalbumin.  Discussed risks of uncontrolled diabetes and possible progression with microalbumin elevation previously.  Has not started meds but did work on diet with weight loss.  Check A1c to check for improvement, and if still elevated recommend starting metformin.

## 2023-01-01 NOTE — Assessment & Plan Note (Signed)
No current meds.  Repeat labs with weight loss as above.  We have discussed use of statin and recommendations with diabetes.

## 2023-01-01 NOTE — Assessment & Plan Note (Signed)
Stable on losartan, check labs.  Continue same regimen.

## 2023-01-01 NOTE — Progress Notes (Signed)
Subjective:  Patient ID: Lonnie Richardson, male    DOB: 1970-11-02  Age: 52 y.o. MRN: [id number]  CC:  Chief Complaint  Patient presents with   Diabetes    Pt doing well, is requesting a cholesterol check as well     HPI Lonnie Richardson presents for   Diabetes: With hyperglycemia, elevated microalbumin of 2.1 at his 09/29/2022 visit.  As well as hyperlipidemia with LDL 114 at that time.  Diet controlled previously.  Recommended starting metformin based on elevated A1c with 63-monthfollow-up. He is on low-dose ARB for hypertension, not currently on statin. He did not start metformin. Tried further diet improvements - lost about 6-7#.  Fasting today - requests repeat lipids with the weight loss.  No regular home readings - needs meter.   Microalbumin: As above 2.1 in December. Optho, foot exam, pneumovax:  [place].  Foot exam: today.  Colon cancer screening: no FH of colon CA, declines screening at this time.  Flu vaccine - declines COVID-vaccine -not recent, declines.   Diabetic Foot Exam - Simple   Simple Foot Form Visual Inspection No deformities, no ulcerations, no other skin breakdown bilaterally: Yes Sensation Testing Intact to touch and monofilament testing bilaterally: Yes Pulse Check Posterior Tibialis and Dorsalis pulse intact bilaterally: Yes Comments     Lab Results  Component Value Date   HGBA1C 7.5 (H) 09/29/2022   HGBA1C 7.3 (H) 03/10/2022   HGBA1C 7.0 (H) 09/05/2021   Lab Results  Component Value Date   MICROALBUR 2.1 (H) 09/29/2022   LDLCALC 114 (H) 09/29/2022   CREATININE 1.13 09/29/2022   Hypertension: Losartan '25mg'$  qd. No side effects.  Home readings: BP Readings from Last 3 Encounters:  01/01/23 124/78  09/29/22 122/72  03/10/22 132/72   Lab Results  Component Value Date   CREATININE 1.13 09/29/2022       History Patient Active Problem List   Diagnosis Date Noted   Elevated  LDL cholesterol level 01/01/2023   Type 2 diabetes mellitus with hyperlipidemia ([facility] 01/01/2023   Family history of early CAD 11/09/2014   Elevated blood sugar 11/09/2014   Abnormal ECG 11/09/2014   Essential hypertension 01/14/2012    Past Medical History:  Diagnosis Date   Diabetes mellitus without complication ([facility]    Hypertension   Review of Systems  Constitutional:  Negative for fatigue and unexpected weight change.  Eyes:  Negative for visual disturbance.  Respiratory:  Negative for cough, chest tightness and shortness of breath.   Cardiovascular:  Negative for chest pain, palpitations and leg swelling.  Gastrointestinal:  Negative for abdominal pain and blood in stool.  Neurological:  Negative for dizziness, light-headedness and headaches.     Objective:   Vitals:   01/01/23 0929  BP: 124/78  Pulse: 62  Temp: 98.2 F (36.8 C)  TempSrc: Temporal  SpO2: 99%  Weight: 165 lb (74.8 kg)  Height: '5\' 8"'$  (1.727 m)     Physical Exam Vitals reviewed.  Constitutional:      Appearance: He is well-developed.  HENT:     Head: Normocephalic and atraumatic.  Neck:     Vascular: No carotid bruit or JVD.  Cardiovascular:     Rate and Rhythm: Normal rate and regular rhythm.     Heart sounds: Normal heart sounds. No murmur heard. Pulmonary:     Effort: Pulmonary effort is normal.     Breath sounds: Normal breath sounds. No rales.  Musculoskeletal:  Right lower leg: No edema.     Left lower leg: No edema.  Skin:    General: Skin is warm and dry.  Neurological:     Mental Status: He is alert and oriented to person, place, and time.  Psychiatric:        Mood and Affect: Mood normal.     Assessment & Plan:  Lonnie Richardson is a 52 y.o. male . Type 2 diabetes mellitus with hyperlipidemia (HCC) Assessment & Plan: Complicated by hyperglycemia, hyperlipidemia, and elevated microalbumin.  Discussed risks of uncontrolled diabetes and possible progression with  microalbumin elevation previously.  Has not started meds but did work on diet with weight loss.  Check A1c to check for improvement, and if still elevated recommend starting metformin.  Orders: -     Hemoglobin A1c -     Comprehensive metabolic panel -     Blood Glucose Monitoring Suppl; 1 each by Does not apply route daily. May substitute to any manufacturer covered by patient's insurance.  Dispense: 1 each; Refill: 0 -     Blood Glucose Test; 1 each by In Vitro route daily. May substitute to any manufacturer covered by patient's insurance.  Dispense: 100 strip; Refill: 0 -     Lancet Device; 1 each by Does not apply route daily. May substitute to any manufacturer covered by patient's insurance.  Dispense: 1 each; Refill: 0 -     Lancets Misc.; 1 each by Does not apply route daily. May substitute to any manufacturer covered by patient's insurance.  Dispense: 100 each; Refill: 0 -     Lipid panel -     Ambulatory referral to Ophthalmology  Essential hypertension Assessment & Plan: Stable on losartan, check labs.  Continue same regimen.   Elevated LDL cholesterol level Assessment & Plan: No current meds.  Repeat labs with weight loss as above.  We have discussed use of statin and recommendations with diabetes.  Orders: -     Lipid panel    Patient Instructions  No change in meds today, but based on your labs I would recommend starting the metformin and possibly cholesterol medication.  Hoping those numbers have improved with your efforts.  Keep up the good work.  Recheck in 3 months.  I will refer you to eye specialist for diabetic retinopathy screening.  Let me know if there are questions and take care.      Signed,   [name], MD [place] 01/01/23 10:01 AM

## 2023-05-28 ENCOUNTER — Encounter: Payer: Self-pay | Admitting: Family Medicine

## 2023-05-28 DIAGNOSIS — E1169 Type 2 diabetes mellitus with other specified complication: Secondary | ICD-10-CM

## 2023-05-29 MED ORDER — FREESTYLE LIBRE 3 SENSOR MISC: 1.0000 | 1 refills | Status: DC

## 2023-09-03 ENCOUNTER — Telehealth: Payer: Self-pay | Admitting: Family Medicine

## 2023-09-03 NOTE — Telephone Encounter (Signed)
Called pt and he is aware of this. Pt states he will call and be seen for screening  Pt also asked for appt to been seen for Diabetes and labs. Appt has been schedule

## 2023-09-03 NOTE — Telephone Encounter (Signed)
Caller name: [facility] On DPR?: Yes  Call back number: [phone]  Provider they see: Shade Flood, MD  Reason for call:  Called to say they've tried several times other several months and he has canceled each time.

## 2023-09-23 ENCOUNTER — Ambulatory Visit: Payer: PRIVATE HEALTH INSURANCE | Admitting: Family Medicine

## 2023-09-23 ENCOUNTER — Encounter: Payer: Self-pay | Admitting: Family Medicine

## 2023-09-23 VITALS — BP 132/80 | HR 56 | Temp 97.3°F | Ht 68.0 in | Wt 161.0 lb

## 2023-09-23 DIAGNOSIS — Z1211 Encounter for screening for malignant neoplasm of colon: Secondary | ICD-10-CM

## 2023-09-23 DIAGNOSIS — I10 Essential (primary) hypertension: Secondary | ICD-10-CM | POA: Diagnosis not present

## 2023-09-23 DIAGNOSIS — E1169 Type 2 diabetes mellitus with other specified complication: Secondary | ICD-10-CM

## 2023-09-23 DIAGNOSIS — E785 Hyperlipidemia, unspecified: Secondary | ICD-10-CM

## 2023-09-23 LAB — COMPREHENSIVE METABOLIC PANEL
ALT: 20 U/L (ref 0–53)
AST: 19 U/L (ref 0–37)
Albumin: 4.6 g/dL (ref 3.5–5.2)
Alkaline Phosphatase: 41 U/L (ref 39–117)
BUN: 10 mg/dL (ref 6–23)
CO2: 29 meq/L (ref 19–32)
Calcium: 9.6 mg/dL (ref 8.4–10.5)
Chloride: 104 meq/L (ref 96–112)
Creatinine, Ser: 0.98 mg/dL (ref 0.40–1.50)
GFR: 90.04 mL/min (ref >60.00)
Glucose, Bld: 96 mg/dL (ref 70–99)
Potassium: 4.2 meq/L (ref 3.5–5.1)
Sodium: 139 meq/L (ref 135–145)
Total Bilirubin: 0.7 mg/dL (ref 0.2–1.2)
Total Protein: 7.3 g/dL (ref 6.0–8.3)

## 2023-09-23 LAB — LIPID PANEL
Cholesterol: 181 mg/dL (ref 0–200)
HDL: 40.7 mg/dL (ref >39.00)
LDL Cholesterol: 118 mg/dL — ABNORMAL HIGH (ref 0–99)
NonHDL: 140.25
Total CHOL/HDL Ratio: 4
Triglycerides: 112 mg/dL (ref 0.0–149.0)
VLDL: 22.4 mg/dL (ref 0.0–40.0)

## 2023-09-23 LAB — HEMOGLOBIN A1C: Hgb A1c MFr Bld: 6.5 % (ref 4.6–6.5)

## 2023-09-23 MED ORDER — LOSARTAN POTASSIUM 25 MG PO TABS: 25.0000 mg | ORAL_TABLET | Freq: Every day | ORAL | 2 refills | Status: DC

## 2023-09-23 MED ORDER — FREESTYLE LIBRE 3 PLUS SENSOR MISC: 3 refills | Status: AC

## 2023-09-23 NOTE — Progress Notes (Signed)
Subjective:  Patient ID: Lonnie Richardson, male    DOB: January 27, 1971  Age: 52 y.o. MRN: [id number]  CC:  Chief Complaint  Patient presents with   Diabetes    Has the [facility]. Checks blood sugar frequently. Needs a note to take with him to [place] as to what tyoe of exam he needs for his diabetes check.     HPI Lonnie Richardson presents for   Diabetes: Complicated by hyperglycemia, elevated urine microalbumin, and hyperlipidemia. We have discussed starting metformin in the past, he declined and worked on further dietary improvements.  Had lost 67 pounds at his March visit. Not on statin but he does take low-dose ARB with underlying hypertension. Freestyle libre for home monitoring, report on phone.  Average 30 day - 125, 90 day 131  Single reading of 55 over 90 days. No other lows.  Cut back on rice/bread - rear 200.   Microalbumin:  Optho, foot exam, pneumovax: Ophthalmology exam in [place] last year, plans on repeat testing on trip this year in December.  Foot exam: Diabetic Foot Exam - Simple   Simple Foot Form Visual Inspection No deformities, no ulcerations, no other skin breakdown bilaterally: Yes Sensation Testing Intact to touch and monofilament testing bilaterally: Yes Pulse Check Posterior Tibialis and Dorsalis pulse intact bilaterally: Yes Comments      Lab Results  Component Value Date   HGBA1C 7.1 (H) 01/01/2023   HGBA1C 7.5 (H) 09/29/2022   HGBA1C 7.3 (H) 03/10/2022   Lab Results  Component Value Date   MICROALBUR 2.1 (H) 09/29/2022   LDLCALC 132 (H) 01/01/2023   CREATININE 0.97 01/01/2023   Hypertension: Losartan 25 mg daily without any side effects. Daily use.  Home readings: rare.  BP Readings from Last 3 Encounters:  09/23/23 132/80  01/01/23 124/78  09/29/22 122/72   Lab Results  Component Value Date   CREATININE 0.97 01/01/2023   Hyperlipidemia: Statin has been recommended previously, especially with history of diabetes, declined  and he is elected to treat with diet/exercise. Still declines statin.  The 10-year ASCVD risk score (Arnett DK, et al., 2019) is: 7.7%   Values used to calculate the score:     Age: 39 years     Sex: Male     Is Non-Hispanic African American: No     Diabetic: Yes     Tobacco smoker: No     Systolic Blood Pressure: 132 mmHg     Is BP treated: Yes     HDL Cholesterol: 54.4 mg/dL     Total Cholesterol: 205 mg/dL  Lab Results  Component Value Date   CHOL 205 (H) 01/01/2023   HDL 54.40 01/01/2023   LDLCALC 132 (H) 01/01/2023   TRIG 91.0 01/01/2023   CHOLHDL 4 01/01/2023   Lab Results  Component Value Date   ALT 14 01/01/2023   AST 16 01/01/2023   ALKPHOS 45 01/01/2023   BILITOT 0.5 01/01/2023    Plans on colonoscopy in March. Singles next visit.      History Patient Active Problem List   Diagnosis Date Noted   Elevated LDL cholesterol level 01/01/2023   Type 2 diabetes mellitus with hyperlipidemia (HCC) 01/01/2023   Family history of early CAD 11/09/2014   Elevated blood sugar 11/09/2014   Abnormal ECG 11/09/2014   Essential hypertension 01/14/2012   Past Medical History:  Diagnosis Date   Diabetes mellitus without complication (HCC)    Hypertension    Past Surgical History:  Procedure Laterality Date  none     No Known Allergies Prior to Admission medications   Medication Sig Start Date End Date Taking? Authorizing Provider  Blood Glucose Monitoring Suppl DEVI 1 each by Does not apply route daily. May substitute to any manufacturer covered by patient's insurance. 01/01/23  Yes Shade Flood, MD  losartan (COZAAR) 25 MG tablet Take 1 tablet (25 mg total) by mouth daily. 09/29/22  Yes Shade Flood, MD   Social History   Socioeconomic History   Marital status: Married    Spouse name: Not on file   Number of children: 1   Years of education: Not on file   Highest education level: Not on file  Occupational History   Not on file  Tobacco Use    Smoking status: Never   Smokeless tobacco: Never  Vaping Use   Vaping status: Never Used  Substance and Sexual Activity   Alcohol use: Yes   Drug use: No   Sexual activity: Yes  Other Topics Concern   Not on file  Social History Narrative   Not on file   Social Determinants of Health   Financial Resource Strain: Not on file  Food Insecurity: Not on file  Transportation Needs: Not on file  Physical Activity: Not on file  Stress: Not on file  Social Connections: Not on file  Intimate Partner Violence: Not on file    Review of Systems  Constitutional:  Negative for fatigue and unexpected weight change.  Eyes:  Negative for visual disturbance.  Respiratory:  Negative for cough, chest tightness and shortness of breath.   Cardiovascular:  Negative for chest pain, palpitations and leg swelling.  Gastrointestinal:  Negative for abdominal pain and blood in stool.  Neurological:  Negative for dizziness, light-headedness and headaches.     Objective:   Vitals:   09/23/23 0945  BP: 132/80  Pulse: (!) 56  Temp: (!) 97.3 F (36.3 C)  SpO2: 100%  Weight: 161 lb (73 kg)  Height: 5\' 8"  (1.727 m)     Physical Exam Vitals reviewed.  Constitutional:      Appearance: He is well-developed.  HENT:     Head: Normocephalic and atraumatic.  Neck:     Vascular: No carotid bruit or JVD.  Cardiovascular:     Rate and Rhythm: Normal rate and regular rhythm.     Heart sounds: Normal heart sounds. No murmur heard. Pulmonary:     Effort: Pulmonary effort is normal.     Breath sounds: Normal breath sounds. No rales.  Musculoskeletal:     Right lower leg: No edema.     Left lower leg: No edema.  Skin:    General: Skin is warm and dry.     Comments: Slight dry skin on feet no wounds, no ulcerations.  Foot exam as above.  Neurological:     Mental Status: He is alert and oriented to person, place, and time.  Psychiatric:        Mood and Affect: Mood normal.        Assessment &  Plan:  Lonnie Richardson is a 52 y.o. male . Type 2 diabetes mellitus with hyperlipidemia (HCC) - Plan: Continuous Glucose Sensor (FREESTYLE LIBRE 3 PLUS SENSOR) MISC, Comprehensive metabolic panel, Lipid panel, Hemoglobin A1c  -Check labs and then can recommend meds if needed at that time.  Has declined medication previously.  Overall reassuring foot exam.  He does note occasional itching of skin but no dysesthesias or pain.  Some dry skin  noted, recommended over-the-counter lotion, cortisone cream as needed with RTC precautions if persistent.  Adjustment of plan based on labs.  Essential hypertension - Plan: losartan (COZAAR) 25 MG tablet, Comprehensive metabolic panel  -Plan current regimen, borderline elevation in office today, home monitoring recommended with call or MyChart if persistent elevations and then would consider increased dose of losartan.  Labs as above.  74-month follow-up if stable Hyperlipidemia, unspecified hyperlipidemia type - Plan: Lipid panel  Screen for colon cancer - Plan: Ambulatory referral to Gastroenterology Requests March appointment.  Referral placed.  Meds ordered this encounter  Medications   Continuous Glucose Sensor (FREESTYLE LIBRE 3 PLUS SENSOR) MISC    Sig: Change sensor every 15 days.    Dispense:  6 each    Refill:  3   losartan (COZAAR) 25 MG tablet    Sig: Take 1 tablet (25 mg total) by mouth daily.    Dispense:  90 tablet    Refill:  2   Patient Instructions  Please have your eye doctor in [place] check for diabetic retinopathy, and send or bring me a report if possible.  Keep a record of your blood pressures outside of the office and if those remain over 130/80 consistently, let me know.  No medication changes for now.  I placed a referral to see the gastroenterologist in March. I refilled your medication and the freestyle libre sensors.  Let me know if you have questions and have a safe trip.      Signed,   [name], MD [place]  [facility], [facility] 09/23/23 10:16 AM

## 2023-09-23 NOTE — Patient Instructions (Addendum)
Please have your eye doctor in [place] check for diabetic retinopathy, and send or bring me a report if possible.  Keep a record of your blood pressures outside of the office and if those remain over 130/80 consistently, let me know.  No medication changes for now.  I placed a referral to see the gastroenterologist in March. I refilled your medication and the freestyle libre sensors.  Let me know if you have questions and have a safe trip.

## 2023-12-04 ENCOUNTER — Ambulatory Visit (INDEPENDENT_AMBULATORY_CARE_PROVIDER_SITE_OTHER): Payer: PRIVATE HEALTH INSURANCE | Admitting: Family

## 2023-12-04 ENCOUNTER — Encounter: Payer: Self-pay | Admitting: Family

## 2023-12-04 VITALS — BP 116/74 | HR 98 | Temp 98.4°F | Ht 68.0 in | Wt 164.6 lb

## 2023-12-04 DIAGNOSIS — R197 Diarrhea, unspecified: Secondary | ICD-10-CM | POA: Diagnosis not present

## 2023-12-04 NOTE — Progress Notes (Signed)
  Lonnie Richardson is a 53 y.o. male with the following history as recorded in EpicCare:  Patient Active Problem List   Diagnosis Date Noted   Elevated LDL cholesterol level 01/01/2023   Type 2 diabetes mellitus with hyperlipidemia (HCC) 01/01/2023   Family history of early CAD 11/09/2014   Elevated blood sugar 11/09/2014   Abnormal ECG 11/09/2014   Essential hypertension 01/14/2012    Current Outpatient Medications  Medication Sig Dispense Refill   Blood Glucose Monitoring Suppl DEVI 1 each by Does not apply route daily. May substitute to any manufacturer covered by patient's insurance. 1 each 0   Continuous Glucose Sensor (FREESTYLE LIBRE 3 PLUS SENSOR) MISC Change sensor every 15 days. 6 each 3   losartan  (COZAAR ) 25 MG tablet Take 1 tablet (25 mg total) by mouth daily. 90 tablet 2   No current facility-administered medications for this visit.    Allergies: Patient has no known allergies.  Past Medical History:  Diagnosis Date   Diabetes mellitus without complication (HCC)    Hypertension     Past Surgical History:  Procedure Laterality Date   none      Family History  Problem Relation Age of Onset   Diabetes Mother    Hypertension Father     Social History   Tobacco Use   Smoking status: Never   Smokeless tobacco: Never  Substance Use Topics   Alcohol use: Yes    Subjective:   Started 4 days ago with sensation that he felt a cold coming on- has just returned from [place] a few days prior; notes he took probiotic 2 days ago which seemed to make things worse; notes he has had diarrhea since then and is having a hard time keeping anything down. Denies any abdominal pain; Does feel that he is doing better today than he was yesterday; did take OTC immodium;     Objective:  Vitals:   12/04/23 1049  BP: 116/74  Pulse: 98  Temp: 98.4 F (36.9 C)  TempSrc: Oral  SpO2: 99%  Weight: 164 lb 9.6 oz (74.7 kg)  Height: 5' 8 (1.727 m)    General: Well developed, well  nourished, in no acute distress  Skin : Warm and dry.  Head: Normocephalic and atraumatic  Eyes: Sclera and conjunctiva clear; pupils round and reactive to light; extraocular movements intact  Ears: External normal; canals clear; tympanic membranes normal  Oropharynx: Pink, supple. No suspicious lesions  Neck: Supple without thyromegaly, adenopathy  Lungs: Respirations unlabored; clear to auscultation bilaterally without wheeze, rales, rhonchi  CVS exam: normal rate and regular rhythm.  Abdomen: Soft; nontender; nondistended; normoactive bowel sounds; no masses or hepatosplenomegaly  Neurologic: Alert and oriented; speech intact; face symmetrical; moves all extremities well; CNII-XII intact without focal deficit   Assessment:  1. Diarrhea, unspecified type     Plan:  Per patient, he is better today than past few days; is well appearing in office; BRAT diet discussed/ need to hydrate; will check CBC, CMP today; follow up worse, no better;   No follow-ups on file.  Orders Placed This Encounter  Procedures   CBC with Differential/Platelet   Comp Met (CMET)    Requested Prescriptions    No prescriptions requested or ordered in this encounter

## 2023-12-05 LAB — CBC WITH DIFFERENTIAL/PLATELET
Basophils Absolute: 0 10*3/uL (ref 0.0–0.2)
Basos: 0 %
EOS (ABSOLUTE): 0 10*3/uL (ref 0.0–0.4)
Eos: 0 %
Hematocrit: 51.1 % — ABNORMAL HIGH (ref 37.5–51.0)
Hemoglobin: 17.1 g/dL (ref 13.0–17.7)
Immature Grans (Abs): 0 10*3/uL (ref 0.0–0.1)
Immature Granulocytes: 0 %
Lymphocytes Absolute: 0.9 10*3/uL (ref 0.7–3.1)
Lymphs: 19 %
MCH: 30.6 pg (ref 26.6–33.0)
MCHC: 33.5 g/dL (ref 31.5–35.7)
MCV: 92 fL (ref 79–97)
Monocytes Absolute: 0.7 10*3/uL (ref 0.1–0.9)
Monocytes: 14 %
Neutrophils Absolute: 3.1 10*3/uL (ref 1.4–7.0)
Neutrophils: 67 %
Platelets: 278 10*3/uL (ref 150–450)
RBC: 5.58 x10E6/uL (ref 4.14–5.80)
RDW: 12.3 % (ref 11.6–15.4)
WBC: 4.8 10*3/uL (ref 3.4–10.8)

## 2023-12-05 LAB — COMPREHENSIVE METABOLIC PANEL
ALT: 126 [IU]/L — ABNORMAL HIGH (ref 0–44)
AST: 94 [IU]/L — ABNORMAL HIGH (ref 0–40)
Albumin: 4.5 g/dL (ref 4.1–5.1)
Alkaline Phosphatase: 49 [IU]/L (ref 44–121)
BUN/Creatinine Ratio: 8 — ABNORMAL LOW (ref 9–20)
BUN: 11 mg/dL (ref 6–24)
Bilirubin Total: 0.5 mg/dL (ref 0.0–1.2)
CO2: 19 mmol/L — ABNORMAL LOW (ref 20–29)
Calcium: 9.6 mg/dL (ref 8.7–10.2)
Chloride: 98 mmol/L (ref 96–106)
Creatinine, Ser: 1.31 mg/dL — ABNORMAL HIGH (ref 0.76–1.27)
Globulin, Total: 3 g/dL (ref 1.5–4.5)
Glucose: 164 mg/dL — ABNORMAL HIGH (ref 70–99)
Potassium: 4.8 mmol/L (ref 3.5–5.2)
Sodium: 133 mmol/L — ABNORMAL LOW (ref 134–144)
Total Protein: 7.5 g/dL (ref 6.0–8.5)
eGFR: 66 mL/min/{1.73_m2} (ref >59)

## 2024-06-16 ENCOUNTER — Other Ambulatory Visit: Payer: Self-pay | Admitting: Family Medicine

## 2024-06-16 DIAGNOSIS — I10 Essential (primary) hypertension: Secondary | ICD-10-CM

## 2024-09-13 ENCOUNTER — Other Ambulatory Visit: Payer: Self-pay | Admitting: Family Medicine

## 2024-09-13 DIAGNOSIS — I10 Essential (primary) hypertension: Secondary | ICD-10-CM
